# Patient Record
Sex: Female | Born: 1990 | Marital: Married | Smoking: Former smoker
Health system: Northeastern US, Academic
[De-identification: ages and names within clinical notes are randomized; demographics above are authoritative.]

---

## 2017-05-23 ENCOUNTER — Ambulatory Visit: Admitting: Family

## 2017-05-23 LAB — HX VAGINITIS SCREEN: CASE NUMBER: 2018309003671

## 2017-05-24 LAB — HX SEXUALLY TRAN DIS (AMP PRB)
CASE NUMBER: 2018309003673
HX C TRACHOMATIS DNA: NOT DETECTED — NL
HX N. GONORRHOEAE DNA: NOT DETECTED — NL
HX TOTAL RLU: 13

## 2017-06-06 ENCOUNTER — Ambulatory Visit

## 2018-03-28 ENCOUNTER — Ambulatory Visit: Admitting: Obstetrics & Gynecology

## 2018-03-29 LAB — HX SEXUALLY TRAN DIS (AMP PRB)
CASE NUMBER: 2019253002283
HX C TRACHOMATIS DNA: NOT DETECTED — NL
HX N. GONORRHOEAE DNA: NOT DETECTED — NL
HX TOTAL RLU: 7

## 2018-04-07 LAB — HX GYN FINAL REPORT
CASE NUMBER: 0
HX FINAL CYTOLOGIC INTERPRETATION: ABNORMAL
HX NOTE: 88141

## 2018-06-28 ENCOUNTER — Ambulatory Visit

## 2018-06-28 NOTE — Progress Notes (Addendum)
* * *        Emma Hartman, Emma Hartman**    --- ---    19 Y old Female, DOB: [birth year]    Account Number: [id number]    [address] [place], [id number]    Home: [phone]    Guarantor: Drema Balzarine Insurance: BCBS-[place]: BCBS (PP[place])    Referring: Carrolyn Meiers External Visit ID: [id number]    Appointment Facility: [facility]        * * *    06/28/2018  Progress Notes: Thereasa Distance, MD    --- ---    ---         **Current Medications**    ---    Taking     * birth control pills     ---    * Medication List reviewed and reconciled with the patient    ---      Past Medical History    ---       Previous PCP: last done in [place].        ---    ....        ---    INFLUENZAE VACCINE: .        ---    DEPRESSION SCREENING: PHQ2-Neg.        ---    GC/CHLAMYDIA: 03/28/2018 neg.        ---    PAP: 03/28/18 [facility] Women health follow up in one year.        ---    TDAP: 2018 thru Immigration process per patient.        ---    TOBACCO USE: former smoker.        ---       **Surgical History**    ---       tonsillectomy    ---      **Family History**    ---       Father: alive, diagnosed with Depression    ---    Mother: alive    ---    Sister: alive, healthy    ---    Paternal Grand Father: alive, Depression    ---    Paternal Grand Mother: alive, Depression    ---    Maternal Grand Father: deceased    ---    Maternal Grand Mother: alive, Cancer    ---    1 sister(s) - healthy.    ---      **Social History**    ---    Children: none.    Marijuana: no.    Education: BS/BA.    Seat belts: always.    Alcohol    alcohol _occasional_    Guns in home: none.    Tobacco Use    Status: _former smoker_    When did you stop smoking? _started at the age of 37 smoked for 5 yrs maybe  10-15 per day_    Patient screened/cessation counseled on: _12/11/2019_    Patient counseled on cessation: _12/11/2019_    Occupation: Research officer, political party.    Sexually active: OCP's.    Drugs: no.    Feels safe in home: yes.    Marital Status: married.       **Allergies**    ---       N.K.D.A.    ---       **Hospitalization/Major Diagnostic Procedure**    ---       No Hospitalization History.    ---       **Review  of Systems**    ---     _PAros_ :    CONSTITUTIONAL Denies Fever, Chills, Fatigue or Weight change.. OPHTHALMOLOGY  Denies redness, discharge or change in vision.Marland Kitchen ENT Denies Earache, Nasal  congestion, Sore throat.. RESPIRATORY Denies Cough, Shortness of breath,  Wheezing.Marland Kitchen CARDIOLOGY Denies Chest pain, Shortness of breath on exertion,  Palpitations or Edema.Marland Kitchen GASTROENTEROLOGY Denies Nausea, vomiting, abdominal  pain, Diarrhea, Constipation or Blood in stool.Marland Kitchen UROLOGY Denies Urinary  Frequency, Burning Sensation, Blood in urine or Flank pain.Marland Kitchen NEUROLOGY Denies  Headache, Dizziness, Paresthesias or Syncope.Deloria Lair Denies Joint  pain, Joint Swelling, Muscle Aches, Decreased Range of Motion.Marland Kitchen PSYCHOLOGY  Denies Anxiety, Depression, Suicidal or Homicidal Ideation.. DERMATOLOGY  Denies Suspicious Lesions or Rash.            **Reason for Appointment**    ---       1\. NP PE;pt confirmed apt ...mf 06/27/18    ---    2\. Was recently on antibiotic for abcess in mouth now has yeast infection and  discharge with odor    ---       **History of Present Illness**    ---     _Depression Screening_ :    PHQ-2 (2015 Edition)    Little interest or pleasure in doing things? _Not at all_    Feeling down, depressed, or hopeless? _Not at all_    Total Score _0_     _General_ :    Emma Hartman is a 27 year old pleasant female who presented to establish care and  annual physical. She has no significant past medical history.    She was recently on antibiotics for 2 Sets which is doing fine now however she  developed a vaginal discharge. She is going to try over-the-counter Monistat    PAP:2019    Tdap:2019    Flu Shot:at local Pharmacy.       **Vital Signs**    ---    BP 120/70, HR 86, Ht 62.25, Wt 112.6, BMI 20.43, RR 18, Oxygen sat % 100.       **Examination**    ---     _General  Examination_ :    GENERAL APPEARANCE: Well developed and well nourished, alert and oriented, no  acute distress, pleasant, cooperative .    OROPHARYNX: moist mucous membranes, no lesions.    NECK: supple, no lymphadenopathy, no thyromegaly, no carotid bruits, normal  ROM, non-tender.    HEART: regular rate and rhythm, normal S1 & S2, no murmurs, rubs, gallops or  clicks.    LUNGS: clear to auscultation bilaterally, no rhonchi, rales or wheezes.    ABDOMEN: benign, soft, non-tender, no organomegaly, bowel sounds are normal,  no guarding, no hepatosplenomegaly, no masses palpated, no rebound, no  rigidity, no suprapubic tenderness, Abdominal bruit: no.    BACK: unremarkable, normal, normal range of motion of spine, no paraspinal  tenderness, normal alignment, no CVA tenderness, no tenderness.    EXTREMITIES: no edema/calves non-tender/no cord, pulses 2 plus bilaterally.    NEUROLOGIC EXAM: grossly non-focal exam, CN's II-XII grossly intact, DTR's 2+  & symmetric all 4 extremities, gait normal, normal sensation and strength.    SKIN: no rash, no atypical nevi.    MUSCULOSKELETAL: no weakness, no deformities.    PSYCHOLOGICAL:  appropriate mood and affect .          **Assessments**    ---    1\. Annual physical exam - Z00.00 (Primary)    ---    2\. Vaginitis - N76.0    ---       **  Treatment**    ---       **1\. Annual physical exam**    _LAB: Urine Dip_   Glucose  neg       --- --- --- ---    Bilirubin  neg       --- --- --- ---    Ketones  neg       --- --- --- ---    Sp Gravity  1.015       --- --- --- ---    Blood  mod       --- --- --- ---    PH  6.0       --- --- --- ---    Protein  neg       --- --- --- ---    Emma Hartman  0.2       --- --- --- ---    Nitrate  neg       --- --- --- ---    Leukocytes  mod       --- --- --- ---       This lab was reviewed by Thereasa Distance MD on 06/30/2018 at 19:00 PM EST    --- ---    _LAB: URINE HCG_ Negative    _LAB: CBC w/ Diff_ RBC  3.69  L  3.90-5.20 - Mil/mm3    --- --- --- ---     Hgb  11.2  L  11.8-16.0 - Gm/dL    --- --- --- ---    Hct  34.3  L  36.0-47.0 - %    --- --- --- ---    MCV  93.0    80.0-100.0 - fL    --- --- --- ---    MCH  30.4    26.0-34.0 - pGm    --- --- --- ---    MCHC  32.7    31.0-37.0 - Gm/dL    --- --- --- ---    Platelet  256    150-400 - thous/mm3    --- --- --- ---    RDW-SD  40.8    35.0-51.0 - fL    --- --- --- ---    MPV  10.9    9.4-12.4 - fL    --- --- --- ---    WBC  5.7    3.7-11.2 - thous/mm3    --- --- --- ---      Blood work normal except for mild anemia. Please add vitamin B12, folate,  total iron, total iron binding capacity and ferritin to patient's labs if  possible Emma Hartman,Emma Hartman 07/05/2018 3:58:15 PM > labs added on for  patient This lab was reviewed by Adalberto Ill [place] on 07/05/2018 at 16:10  PM EST    --- ---    _LAB: Comprehensive Metabolic Panel_ Creatinine  0.577    0.550-1.300 - mg/dL    --- --- --- ---    Sodium Lvl  139    136-146 - mmol/L    --- --- --- ---    Potassium Lvl  4.0    3.6-5.2 - mmol/L    --- --- --- ---    Chloride  109    98-110 - mmol/L    --- --- --- ---    CO2  25    21-32 - mmol/L    --- --- --- ---    Total Protein  6.7    6.0-8.4 - Gm/dL    --- --- --- ---    Albumin Lvl  3.6  3.2-5.0 - Gm/dL    --- --- --- ---    Calcium Lvl  8.7    8.5-10.5 - mg/dL    --- --- --- ---    Glucose Lvl  80    70-110 - mg/dL    --- --- --- ---    Bili Total  0.6    0.2-1.2 - mg/dL    --- --- --- ---    Alk Phos  37    30-117 - Units/L    --- --- --- ---    AST  10    6-40 - Units/L    --- --- --- ---    ALT  17    6-55 - Units/L    --- --- --- ---    Anion Gap  5    3-11 -    --- --- --- ---    BUN  10    6-20 - mg/dL    --- --- --- ---      Blood work normal except for mild anemia. Please add vitamin B12, folate,  total iron, total iron binding capacity and ferritin to patient's labs if  possible Emma Hartman,Emma Hartman 07/05/2018 3:58:15 PM > labs added on for  patient This lab was reviewed by Adalberto Ill [place] on  07/05/2018 at 16:10  PM EST    --- ---    _LAB: Lipid Panel_ Chol  189    <=200 - mg/dL    --- --- --- ---    HDL  108    >=40 - mg/dL    --- --- --- ---    Trig  75    <=150 - mg/dL    --- --- --- ---    LDL  66    <=130 - mg/dL    --- --- --- ---    Status  Done     \-    --- --- --- ---      Blood work normal except for mild anemia. Please add vitamin B12, folate,  total iron, total iron binding capacity and ferritin to patient's labs if  possible Emma Hartman,Emma Hartman 07/05/2018 3:58:15 PM > labs added on for  patient This lab was reviewed by Adalberto Ill [place] on 07/05/2018 at 16:10  PM EST    --- ---    _LAB: TSH Reflex Panel (Recommended)_ 3rd Gen TSH  2.710    0.358-3.740 -  mclU/ml    --- --- --- ---      Blood work normal except for mild anemia. Please add vitamin B12, folate,  total iron, total iron binding capacity and ferritin to patient's labs if  possible Emma Hartman,Emma Hartman 07/05/2018 3:58:15 PM > labs added on for  patient This lab was reviewed by Adalberto Ill [place] on 07/05/2018 at 16:10  PM EST    --- ---        Notes: Appears healthy, discussed healthy diet exercising and getting him a  BMI blood work requested will call with results. A urine dip was done because  of the vaginal discharge which showed some blood and leukocytes. Patient is  not on her period. Will send for culture. Follow-up in one year or earlier if  needed.        **2\. Vaginitis**    _LAB: Urine Culture_   C Urine   Laboratory, [facility], [address],  [place], [place], [address]     \-    --- --- --- ---       This lab was reviewed  by Thereasa Distance MD on 06/30/2018 at 18:59 PM EST    --- ---         **3\. Others**    Notes: pt will bring her Birth control rx and we will prescribe similar.      **Procedure Codes**    ---       02542 Strip Urine Tests, manual w/out microscopy    ---    70623 URINE PREGNANCY TEST    ---       **Follow Up**    ---    1 Year    Electronically signed by Thereasa Distance MD , MD on  06/28/2018 at 01:21 PM EST    Sign off status: Completed        * * [facility]    [address]    [place], [place] [address]    Tel: [phone]    Fax: [phone]              * * *          Patient: Emma Hartman, Emma Hartman DOB: 1990/11/08 Progress Note: Thereasa Distance, MD  06/28/2018    ---    Note generated by eClinicalWorks EMR/PM Software ([email or web address])

## 2018-06-30 ENCOUNTER — Ambulatory Visit

## 2018-06-30 NOTE — Progress Notes (Addendum)
* * *        **  Emma Hartman, Emma Hartman**    --- ---    86 Y old Female, DOB: Oct 20, 1990    [address], [place], [place] [address]    Home: [phone]    Provider: [name], Wilena Tyndall        * * *    Telephone Encounter    ---    Answered by   [name], Bryan Goin  Date: 06/30/2018         Time: 06:56 PM    Reason   UA+    --- ---            Message                      Called pt to discuss results, left message, urine is + for Ecoli , since pt was symptomatic rx for Macrobid sent                Refills  Start Macrobid Capsule, 100 MG, Orally, 14, 1 capsule with food,  every 12 hrs, 7 day(s), Refills=0    --- ---          * * *                ---          * * *          Patient: Emma Hartman, Emma Hartman DOB: 1991/07/08 Provider: [name], Melana Hingle 06/30/2018    ---    Note generated by eClinicalWorks EMR/PM Software ([email or web address])

## 2018-07-04 ENCOUNTER — Ambulatory Visit

## 2018-07-04 LAB — HX LIPID PANEL
CASE NUMBER: 2019351000760
HX CHOL: 189 mg/dL — NL
HX HDL: 108 mg/dL — NL
HX LDL: 66 mg/dL — NL
HX TRIG: 75 mg/dL — NL

## 2018-07-04 LAB — HX GLOMERULAR FILTRATION RATE (ESTIMATED)
CASE NUMBER: 2019351000760
HX AFN AMER GLOMERULAR FILTRATION RATE: >90
HX NON-AFN AMER GLOMERULAR FILTRATION RATE: >90

## 2018-07-04 LAB — HX .AUTOMATED DIFF
CASE NUMBER: 2019351000760
HX ABSOLUTE BASO COUNT: 0.02 10*3/uL — NL (ref 0.0–0.22)
HX ABSOLUTE EOS COUNT: 0.25 10*3/uL — NL (ref 0.0–0.45)
HX ABSOLUTE LYMPHS COUNT: 2.72 10*3/uL — NL (ref 0.74–5.04)
HX ABSOLUTE MONO COUNT: 0.39 10*3/uL — NL (ref 0.0–1.34)
HX ABSOLUTE NEUTRO COUNT: 2.34 10*3/uL — NL (ref 1.48–7.95)
HX BASOPHILS: 0.3 %
HX EOSINOPHILS: 4.4 %
HX IMMATURE GRANULOCYTES: 0.2 % — NL (ref 0.0–2.0)
HX LYMPHOCYTES: 47.5 %
HX MONOCYTES: 6.8 %
HX NEUTROPHILS: 40.8 %

## 2018-07-04 LAB — HX CBC W/ DIFF
CASE NUMBER: 2019351000760
HX ABSOLUTE NRBC COUNT: 0 10*3/uL
HX HCT: 34.3 % — ABNORMAL LOW (ref 36.0–47.0)
HX HGB: 11.2 g/dL — ABNORMAL LOW (ref 11.8–16.0)
HX MCH: 30.4 pg — NL (ref 26.0–34.0)
HX MCHC: 32.7 g/dL — NL (ref 31.0–37.0)
HX MCV: 93 fL — NL (ref 80.0–100.0)
HX MPV: 10.9 fL — NL (ref 9.4–12.4)
HX NRBC PERCENT: 0 % — NL
HX PLATELET: 256 10*3/uL — NL (ref 150.0–400.0)
HX RBC: 3.69 10*6/uL — ABNORMAL LOW (ref 3.9–5.2)
HX RDW-CV: 11.9 % — NL (ref 11.5–14.5)
HX RDW-SD: 40.8 fL — NL (ref 35.0–51.0)
HX WBC: 5.7 10*3/uL — NL (ref 3.7–11.2)

## 2018-07-04 LAB — HX COMPREHENSIVE METABOLIC PANEL
CASE NUMBER: 2019351000760
HX ALBUMIN LVL: 3.6 g/dL — NL (ref 3.2–5.0)
HX ALKALINE PHOSPHATASE: 37 U/L — NL (ref 30.0–117.0)
HX ALT: 17 U/L — NL (ref 6.0–55.0)
HX ANION GAP: 5 — NL (ref 3.0–11.0)
HX AST: 10 U/L — NL (ref 6.0–40.0)
HX BILIRUBIN TOTAL: 0.6 mg/dL — NL (ref 0.2–1.2)
HX BUN: 10 mg/dL — NL (ref 6.0–20.0)
HX CALCIUM LVL: 8.7 mg/dL — NL (ref 8.5–10.5)
HX CHLORIDE: 109 mmol/L — NL (ref 98.0–110.0)
HX CO2: 25 mmol/L — NL (ref 21.0–32.0)
HX CREATININE: 0.577 mg/dL — NL (ref 0.55–1.3)
HX GLUCOSE LVL: 80 mg/dL — NL (ref 70.0–110.0)
HX POTASSIUM LVL: 4 mmol/L — NL (ref 3.6–5.2)
HX SODIUM LVL: 139 mmol/L — NL (ref 136.0–146.0)
HX TOTAL PROTEIN: 6.7 g/dL — NL (ref 6.0–8.4)

## 2018-07-04 LAB — HX TSH REFLEX PANEL (RECOMMENDED)
CASE NUMBER: 2019351000760
HX 3RD GEN TSH: 2.71 u[IU]/mL — NL (ref 0.358–3.74)

## 2018-07-04 NOTE — Progress Notes (Addendum)
* * *        **  Emma Hartman, Emma Hartman**    --- ---    74 Y old Female, DOB: [birth year]    [address], [place], [place] [address]    Home: [phone]    Provider: [name], Jahir Halt        * * *    Telephone Encounter    ---    Answered by   Sherian Rein, Christine  Date: 07/04/2018         Time: 09:23 AM    Message                      patient came in with copy of Birth control information sheet name of BC is Diane 35      patient would like results of urine culture can you please review.       patient also brought in her Immunization paperwork and needs titres done only tdap noted please order labs for patient, thank you she had her blood work done this morning for other testing ordered        --- ---            Action Taken                      Abuyog LPN,Donaveil   62/13/0865 11:03:08 AM > pt called inquiring about her birth control rx. pharmacy has not recvd this. pls advise      Colbe Viviano MD,Shatora Weatherbee  07/13/2018 9:09:30 PM > The OCP that Pt is requesting is not available in [place] and the combination has safety concerns. I know pt expressed her concerns about other OCP's . If she wants we can do a referral  for Gyn      Deschene [place],Christine  07/14/2018 10:47:07 AM > patient informed please schedule apn't with GYN for as soon as possible, thank you      [facility]  07/14/2018 10:57:12 AM > appointment with Dr. Janie Morning, OB-GYN, [address] [place], [place], [phone], on 08/07/18 @ 8:00 am      [name]  07/14/2018 10:57:28 AM > spoke to patient she is aware, also emailed the information                    * * *              * * *        ---         ---          * * *          Patient: Emma Hartman, Emma Hartman DOB: [birth year] Provider: [name], Ethleen Lormand 07/04/2018    ---    Note generated by eClinicalWorks EMR/PM Software ([email or web address])

## 2018-07-05 ENCOUNTER — Ambulatory Visit

## 2018-07-05 LAB — HX IRON PROFILE
CASE NUMBER: 2019351000760
HX % IRON BOUND: 43 % — NL (ref 10.0–50.0)
HX FERRITIN LVL: 19 ng/mL — NL (ref 11.0–307.0)
HX IRON: 171 ug/dL — ABNORMAL HIGH (ref 30.0–160.0)
HX TIBC: 400 ug/dL — NL (ref 250.0–450.0)

## 2018-07-05 LAB — HX VITAMIN B12 LEVEL
CASE NUMBER: 2019351000760
HX VITAMIN B12 LVL: 406 pg/mL — NL (ref 193.0–986.0)

## 2018-07-05 LAB — HX FOLATE LEVEL
CASE NUMBER: 2019351000760
HX FOLATE LVL: 17.7 ng/mL — NL

## 2018-07-05 NOTE — Progress Notes (Addendum)
* * *        **  Emma Hartman, Emma Hartman**    --- ---    69 Y old Female, DOB: November 14, 1990    [address], [place], [place] [address]    Home: [phone]    Provider: [name], Dina Mobley        * * *    Telephone Encounter    ---    Answered by   Sherian Rein, Christine  Date: 07/05/2018         Time: 03:59 PM    Reason   lab order    --- ---            Message                      add on labs sent to lab for patient per dr. Luisa Hart                    * * *              * * *        ---        Reason for Appointment    ---      1\. Lab order    ---      Assessments    ---    1\. Anemia - D64.9 (Primary)    ---      Treatment    ---       **1\. Anemia**    _LAB: Ferritin_    _LAB: Folate Level_    _LAB: Iron Binding Capacity Total_    _LAB: Iron Level_    _LAB: Vitamin B12 Level_    ---          * * *           Patient: Emma Hartman, Emma Hartman DOB: June 07, 1991 Provider: [name], Elysha Daw 07/05/2018    ---    Note generated by eClinicalWorks EMR/PM Software ([email or web address])

## 2018-07-21 ENCOUNTER — Ambulatory Visit

## 2018-08-07 ENCOUNTER — Ambulatory Visit: Admitting: Obstetrics & Gynecology

## 2018-12-26 ENCOUNTER — Ambulatory Visit

## 2018-12-26 NOTE — Progress Notes (Addendum)
* * *      Hartman, Emma **DOB:** 1990/11/06 (27 yo F) **Acc No.** [id number] **DOS:**  12/26/2018    ---       [name], Blakelee**    ------    60 Y old Female, DOB: [birth year]    [address], [place], [place] [address]    Home: [phone]    Provider: [name], [name]        * * *    Telephone Encounter    ---    Answered by  [name] Date: 12/26/2018       Time: 10:10 AM    Caller  pt husband    ------            Reason  wants c/b from office            Message                     red spots on body- [phone]                Action Taken                     Deschene [place],Christine  12/27/2018 11:44:13 AM > lmtcb      Deschene Emmett,Christine  12/27/2018 12:49:08 PM > patient called states she stopped taking her birth control because she is trying to get pregnant and now has bad acne especially on her back, patient states that is one of the reasons she was on birth control to help with the acne.  Patient would like referral to dermatologist at this time please advise      [name] MD,[name]  12/27/2018 3:05:41 PM > ok for the referral to derm      [name]  12/28/2018 1:07:02 PM > working on apt                     * * *                ---          * * *         Provider: [name], [name] 12/26/2018    ---    Note generated by eClinicalWorks EMR/PM Software ([email or web address])

## 2019-03-07 ENCOUNTER — Ambulatory Visit

## 2019-03-14 ENCOUNTER — Ambulatory Visit: Admitting: Obstetrics & Gynecology

## 2019-03-19 ENCOUNTER — Ambulatory Visit: Admitting: Obstetrics & Gynecology

## 2019-03-19 LAB — HX URINALYSIS (COMPLETE)
CASE NUMBER: 2020244003763
HX UA BILIRUBIN: NEGATIVE — NL
HX UA BLOOD: NEGATIVE — NL
HX UA GLUCOSE: NEGATIVE — NL
HX UA KETONES: NEGATIVE — NL
HX UA LEUKOCYTE ESTERASE: NEGATIVE — NL
HX UA NITRITE: NEGATIVE — NL
HX UA PH: 6 — NL (ref 5.0–8.0)
HX UA PROTEIN: NEGATIVE — NL
HX UA RBC: 1 /HPF — NL (ref 0.0–2.0)
HX UA SPECIFIC GRAVITY: 1.019 — NL (ref 1.003–1.03)
HX UA SQUAMOUS EPITHELIAL: <1 — NL (ref 0.0–5.0)
HX UA UROBILINOGEN: NEGATIVE — NL
HX UA WBC: <1 — NL (ref 0.0–5.0)

## 2019-03-19 LAB — HX CBC W/ INDICES
CASE NUMBER: 2020244003014
HX ABSOLUTE NRBC COUNT: 0 10*3/uL
HX HCT: 34.7 % — ABNORMAL LOW (ref 36.0–47.0)
HX HGB: 11.7 g/dL — ABNORMAL LOW (ref 11.8–16.0)
HX MCH: 32.2 pg — NL (ref 26.0–34.0)
HX MCHC: 33.7 g/dL — NL (ref 31.0–37.0)
HX MCV: 95.6 fL — NL (ref 80.0–100.0)
HX MPV: 10.8 fL — NL (ref 9.4–12.4)
HX NRBC PERCENT: 0 % — NL
HX PLATELET: 270 10*3/uL — NL (ref 150.0–400.0)
HX RBC: 3.63 10*6/uL — ABNORMAL LOW (ref 3.9–5.2)
HX RDW-CV: 11.5 % — NL (ref 11.5–14.5)
HX RDW-SD: 40.2 fL — NL (ref 35.0–51.0)
HX WBC: 10.3 10*3/uL — NL (ref 3.7–11.2)

## 2019-03-19 LAB — HX HEPATITIS C ANTIBODY
CASE NUMBER: 2020244003014
HX HEP C AB INST: NONREACTIVE
HX HEP C AB MEASURED: 0.1 — NL (ref 0.0–0.8)
HX HEP C AB: NONREACTIVE

## 2019-03-19 LAB — HX HEPATITIS B SURFACE ANTIGEN PRENATAL
CASE NUMBER: 2020244003014
HX HBSAG MEASURED: <0.1 — NL (ref 0.0–0.99)
HX HEP B SURFACE AG PRENATAL: NEGATIVE
HX HEP BS AG INST: NONREACTIVE

## 2019-03-19 LAB — HX RUBELLA ANTIBODY IGG
CASE NUMBER: 2020244003014
HX RUBELLA IGG: 56.7 [IU]/mL

## 2019-03-19 LAB — HX HIV 1/2 ANTIGEN/ANTIBODY COMBINATION ASS
CASE NUMBER: 2020244003015
HX HIV-1/HIV-2 AG/AB COMBO SCREEN: NONREACTIVE

## 2019-03-20 LAB — HX SEXUALLY TRAN DIS (AMP PRB)
CASE NUMBER: 2020244003765
HX C TRACHOMATIS DNA: NOT DETECTED
HX N. GONORRHOEAE DNA: NOT DETECTED

## 2019-03-20 LAB — HX ABO/RH TYPE
CASE NUMBER: 2020244003014
HX ABO/RH TYPE: O POS — NL

## 2019-03-20 LAB — HX ANTIBODY SCREEN
CASE NUMBER: 2020244003014
HX ANTIBODY SCREEN AUTOMATED: NEGATIVE — NL

## 2019-03-20 LAB — HX RPR
CASE NUMBER: 2020244003014
HX RPR QUAL: NONREACTIVE — NL

## 2019-03-20 LAB — HX URINE CULTURE
CASE NUMBER: 2020244003764
HX F: NO GROWTH

## 2019-03-23 LAB — HX GYN FINAL REPORT
CASE NUMBER: 0
HX FINAL CYTOLOGIC INTERPRETATION: NEGATIVE

## 2019-03-23 LAB — HX HEMOGLOBIN EVALUATION
CASE NUMBER: 2020244003014
HX HGB A1: 96.1 %
HX HGB A2: 2.4 %
HX HGB F: 1.5 %

## 2019-04-16 ENCOUNTER — Ambulatory Visit: Admitting: Obstetrics & Gynecology

## 2019-04-16 ENCOUNTER — Ambulatory Visit: Admitting: Specialist

## 2019-04-16 LAB — HX .MYRIAD KIT DRAW
CASE NUMBER: 2020272001328
HX FORESIGHT KIT: 11004512600000
HX PRELUDE KIT: 31004878200000

## 2019-05-14 ENCOUNTER — Ambulatory Visit: Admitting: Obstetrics & Gynecology

## 2019-06-12 ENCOUNTER — Ambulatory Visit: Admitting: Advanced Practice Midwife

## 2019-06-12 ENCOUNTER — Ambulatory Visit: Admitting: Obstetrics & Gynecology

## 2019-06-26 ENCOUNTER — Ambulatory Visit: Admitting: Obstetrics & Gynecology

## 2019-07-10 ENCOUNTER — Ambulatory Visit: Admitting: Advanced Practice Midwife

## 2019-07-19 ENCOUNTER — Ambulatory Visit

## 2019-07-19 LAB — HX COMPREHENSIVE METABOLIC PANEL
CASE NUMBER: 2020366001169
HX ALBUMIN LVL: 3 g/dL — ABNORMAL LOW (ref 3.2–5.0)
HX ALKALINE PHOSPHATASE: 71 U/L — NL (ref 30.0–117.0)
HX ALT: 35 U/L — NL (ref 6.0–55.0)
HX ANION GAP: 7 — NL (ref 3.0–11.0)
HX AST: 23 U/L — NL (ref 6.0–40.0)
HX BILIRUBIN TOTAL: 0.3 mg/dL — NL (ref 0.2–1.2)
HX BUN: 8 mg/dL — NL (ref 6.0–20.0)
HX CALCIUM LVL: 9.2 mg/dL — NL (ref 8.5–10.5)
HX CHLORIDE: 107 mmol/L — NL (ref 98.0–110.0)
HX CO2: 26 mmol/L — NL (ref 21.0–32.0)
HX CREATININE: 0.365 mg/dL — ABNORMAL LOW (ref 0.55–1.3)
HX GLUCOSE LVL: 88 mg/dL — NL (ref 70.0–110.0)
HX POTASSIUM LVL: 4.2 mmol/L — NL (ref 3.6–5.2)
HX SODIUM LVL: 140 mmol/L — NL (ref 136.0–146.0)
HX TOTAL PROTEIN: 6.2 g/dL — NL (ref 6.0–8.4)

## 2019-07-19 LAB — HX GLOMERULAR FILTRATION RATE (ESTIMATED)
CASE NUMBER: 2020366001169
HX AFN AMER GLOMERULAR FILTRATION RATE: >90
HX NON-AFN AMER GLOMERULAR FILTRATION RATE: >90

## 2019-07-19 LAB — HX TSH REFLEX PANEL (RECOMMENDED)
CASE NUMBER: 2020366001169
HX 3RD GEN TSH: 1.63 u[IU]/mL — NL (ref 0.358–3.74)

## 2019-07-19 NOTE — Progress Notes (Addendum)
 * * *    [name], [name] **DOB:** 15-Dec-1990 (28 yo F) **Acc No.** [id number] **DOS:**  07/19/2019    ---       Emma Hartman, Emma Hartman**    ------    66 Y old Female, DOB: [birth year]    Account Number: [id number]    [address] [place], [place]    Home: [phone]    Guarantor: Drema Balzarine Insurance: BCBS-: BCBS (PPO)    Referring: Carrolyn Meiers External Visit ID: [id number]    Appointment Facility: [facility]        * * *    07/19/2019 Progress Notes: Thereasa Distance, MD    ------    ---        ---     Past Medical History    ---      Previous PCP: last done in [place].        ---    ....        ---    INFLUENZAE VACCINE: .        ---    DEPRESSION SCREENING: PHQ2-Neg.        ---    GC/CHLAMYDIA: 03/28/2018 neg.        ---    PAP: 03/28/18 LGSIL Women health follow up in one year; 03/19/19 neg.        ---    TDAP: 2018 thru Immigration process per patient.        ---    TOBACCO USE: former smoker.        ---      **Surgical History**    ---      tonsillectomy    ---     **Family History**    ---      Father: alive, diagnosed with Depression    ---    Mother: alive    ---    Sister: alive, healthy    ---    Paternal Grand Father: alive, Depression    ---    Paternal Grand Mother: alive, Depression    ---    Maternal Grand Father: deceased    ---    Maternal Grand Mother: alive, Cancer    ---    1 sister(s) - healthy.    ---     **Social History**    ---     _General:_    Marijuana: no.    Seat belts: always.    Guns in home: none.    Feels safe in home: yes.    Drugs: no.    Alcohol: alcohol occasional.    Children: none.    Education: BS/BA.    Marital Status: married.    Occupation: Research officer, political party.    Sexually active: OCP's.    Tobacco Use: Status: former smoker, When did you stop smoking? started at the  age of 68 smoked for 5 yrs maybe 10-15 per day, Patient screened/cessation  counseled on: 07/19/2019.     **Allergies**    ---      N.K.D.A.    ---      **Hospitalization/Major Diagnostic  Procedure**    ---      No Hospitalization History.    ---      **Review of Systems**    ---     _PAros_ :    CONSTITUTIONAL Denies Fever, Chills, Fatigue .Marland Kitchen OPHTHALMOLOGY Denies redness,  discharge or change in vision.Marland Kitchen ENT Denies Earache, Nasal congestion, Sore  throat.. RESPIRATORY Denies Cough, Shortness of breath, Wheezing.Marland Kitchen CARDIOLOGY  Denies Chest pain, Shortness of breath on exertion, Palpitations or Edema.Marland Kitchen  GASTROENTEROLOGY Denies Nausea, vomiting, abdominal pain, Diarrhea,  Constipation or Blood in stool.Marland Kitchen UROLOGY Denies Urinary Frequency, Burning  Sensation, Blood in urine or Flank pain.Marland Kitchen NEUROLOGY Denies Headache,  Dizziness, Paresthesias or Syncope.Occasionaly feeling weak. MUSCULOSKELETAL  Denies Joint pain, Joint Swelling, Muscle Aches, Decreased Range of Motion.Marland Kitchen  PSYCHOLOGY Denies Anxiety, Depression, Suicidal or Homicidal Ideation..  DERMATOLOGY Denies Suspicious Lesions or Rash.          **Reason for Appointment**    ---      1\. PE;conf appt, neg screen 07/18/19    ---    2\. [redacted] weeks pregnant would like to discuss feeling dizzy at times and ?anemia    ---      **History of Present Illness**    ---     _General_ :    Emma Hartman is a 28 year old pleasant female who presented for annual physical she is  following with the OB/GYN for prenatal care she is [redacted] weeks pregnant. Her only  concern is occasional dizziness and weakness    PAP: 2020    Tdap: Up-to-date    Flu Shot: Received per patient.      **Vital Signs**    ---    Temp 98.3, HR 106, Weight Change 17.4 lb, RR 18, BP 94/60, Oxygen sat % 100,  Ht 62.25, Wt 130, BMI 23.58.      **Examination**    ---     _General Examination:_    GENERAL APPEARANCE: Well developed and well nourished, alert and oriented, no  acute distress, pleasant, cooperative .    OROPHARYNX: moist mucous membranes, no lesions.    NECK: supple, no lymphadenopathy, no thyromegaly, no carotid bruits, normal  ROM, non-tender.    HEART: regular rate and rhythm, normal S1 & S2,  no murmurs, rubs, gallops or  clicks.    LUNGS: clear to auscultation bilaterally, no rhonchi, rales or wheezes.    ABDOMEN: benign, soft, non-tender, no organomegaly, bowel sounds are normal,  no guarding, no hepatosplenomegaly, no masses palpated, no rebound, no  rigidity, no suprapubic tenderness, Abdominal bruit: no.    BACK: unremarkable, normal, normal range of motion of spine, no paraspinal  tenderness, normal alignment, no CVA tenderness, no tenderness.    EXTREMITIES: no edema/calves non-tender/no cord, pulses 2 plus bilaterally.    NEUROLOGIC EXAM: grossly non-focal exam, CN's II-XII grossly intact, DTR's 2+  & symmetric all 4 extremities, gait normal, normal sensation and strength.    SKIN: no rash, no atypical nevi.    MUSCULOSKELETAL: no weakness, no deformities.    PSYCHOLOGICAL: appropriate mood and affect .         **Assessments**    ---    1\. Annual physical exam - Z00.00 (Primary)    ---    2\. [redacted] weeks gestation of pregnancy - Z3A.25    ---      **Treatment**    ---      **1\. Annual physical exam**    _LAB: Comprehensive Metabolic Panel_   Value Reference Range    ---------    Creatinine 0.365 L 0.550-1.300 - mg/dL    Sodium Lvl 254  270-623 - mmol/L    ------------    Potassium Lvl 4.2  3.6-5.2 - mmol/L    ------------    Chloride 107  98-110 - mmol/L    ------------    CO2 26  21-32 - mmol/L    ------------    Total Protein 6.2  6.0-8.4 - Gm/dL    ------------    Albumin Lvl 3.0 L 3.2-5.0 - Gm/dL    ------------    Calcium Lvl 9.2  8.5-10.5 - mg/dL    ------------    Glucose Lvl 88  70-110 - mg/dL    ------------    Bili Total 0.3  0.2-1.2 - mg/dL    ------------    Alk Phos 71  30-117 - Units/L    ------------    AST 23  6-40 - Units/L    ------------    ALT 35  6-55 - Units/L    ------------    Anion Gap 7  3-11 -    ------------    BUN 8  6-20 - mg/dL    ------------     normal tsh and cmp  Emma Hartman,Emma Hartman 07/23/2019 3:51:38 PM > lmtcb  Emma [place],Emma Hartman 07/23/2019 4:10:56 PM > patient informed of results This  lab was reviewed by Adalberto Ill [place] on 07/23/2019 at 16:11 PM EST    ------    _LAB: TSH Reflex Panel (Recommended)_  Value Reference Range    ---------    3rd Gen TSH 1.630  0.358-3.740 - mclU/ml     normal tsh and cmp Emma [place],Emma Hartman 07/23/2019 3:51:38 PM > lmtcb  Emma [place],Emma Hartman 07/23/2019 4:10:56 PM > patient informed of results This  lab was reviewed by Adalberto Ill [place] on 07/23/2019 at 16:11 PM EST    ------        Notes: Patient Educated with: Physical activity.pdf (Physical activity.pdf)        Appears healthy, discussed healthy diet and exercise. Reviewed the recent test  results which showed normal CBC. Patient feels occasional dizziness most  likely because of the low blood pressure advice patient on adequate hydration  and if needed salty snacks. She will monitor her blood pressure closely. We  will also check thyroid and electrolytes.        **2\. [redacted] weeks gestation of pregnancy**    Notes: Continue prenatal vitamins and follow-up with the OB/GYN.     **Visit Codes**    ---      [phone] PREV MED VISIT EST PT 18-39 YR.    ---     **Follow Up**    ---    1 Year (Reason: px)    Electronically signed by Thereasa Hartman , MD on 07/19/2019 at 09:38 AM EST    Sign off status: Completed        * * [facility]    [address]    [place], [place] [address]    Tel: [phone]    Fax: [phone]              * * *          Progress Note: Thereasa Distance, MD 07/19/2019    ---    Note generated by eClinicalWorks EMR/PM Software ([email or web address])

## 2019-08-10 ENCOUNTER — Ambulatory Visit: Admitting: Specialist

## 2019-08-10 LAB — HX CBC W/ INDICES
CASE NUMBER: 2021022002908
HX ABSOLUTE NRBC COUNT: 0 10*3/uL
HX HCT: 32.9 % — ABNORMAL LOW (ref 36.0–47.0)
HX HGB: 11 g/dL — ABNORMAL LOW (ref 11.8–16.0)
HX MCH: 32.6 pg — NL (ref 26.0–34.0)
HX MCHC: 33.4 g/dL — NL (ref 31.0–37.0)
HX MCV: 97.6 fL — NL (ref 80.0–100.0)
HX MPV: 11.1 fL — NL (ref 9.4–12.4)
HX NRBC PERCENT: 0 % — NL
HX PLATELET: 273 10*3/uL — NL (ref 150.0–400.0)
HX RBC: 3.37 10*6/uL — ABNORMAL LOW (ref 3.9–5.2)
HX RDW-CV: 12.2 % — NL (ref 11.5–14.5)
HX RDW-SD: 43.9 fL — NL (ref 35.0–51.0)
HX WBC: 12.2 10*3/uL — ABNORMAL HIGH (ref 3.7–11.2)

## 2019-08-10 LAB — HX HEPATITIS C ANTIBODY
CASE NUMBER: 2021022002908
HX HEP C AB INST: NONREACTIVE
HX HEP C AB MEASURED: 0.03 — NL (ref 0.0–0.8)
HX HEP C AB: NONREACTIVE

## 2019-08-10 LAB — HX HIV 1/2 ANTIGEN/ANTIBODY COMBINATION ASS
CASE NUMBER: 2021022002909
HX HIV-1/HIV-2 AG/AB COMBO SCREEN: NONREACTIVE

## 2019-08-11 ENCOUNTER — Ambulatory Visit: Admitting: Specialist

## 2019-08-11 LAB — HX .GLUCOSE 3 HR PRENATAL
CASE NUMBER: 2021023001079
HX GLU 3 HR PRENATAL: 79 mg/dL — NL (ref 70.0–139.0)

## 2019-08-11 LAB — HX .GLUCOSE 2 HR PRENATAL
CASE NUMBER: 2021023000837
HX GLU 2 HR PRENATAL: 79 mg/dL — NL (ref 70.0–154.0)

## 2019-08-11 LAB — HX ANTIBODY SCREEN
CASE NUMBER: 2021022002908
HX ANTIBODY SCREEN AUTOMATED: NEGATIVE — NL

## 2019-08-11 LAB — HX ABO/RH TYPE
CASE NUMBER: 2021022002908
HX ABO/RH TYPE: O POS — NL

## 2019-08-11 LAB — HX .GLUCOSE BASELINE PRENATAL
CASE NUMBER: 2021023000541
HX GLUC FASTING PRENATAL: 72 mg/dL — NL (ref 70.0–94.0)

## 2019-08-11 LAB — HX RPR
CASE NUMBER: 2021022002908
HX RPR QUAL: NONREACTIVE — NL

## 2019-08-11 LAB — HX .GLUCOSE 1 HR PRENATAL
CASE NUMBER: 2021023000690
HX GLU 1 HR PRENATAL: 110 mg/dL — NL (ref 70.0–179.0)

## 2019-08-30 ENCOUNTER — Ambulatory Visit: Admitting: Specialist

## 2019-09-13 ENCOUNTER — Ambulatory Visit: Admitting: Advanced Practice Midwife

## 2019-09-24 ENCOUNTER — Ambulatory Visit: Admitting: Obstetrics & Gynecology

## 2019-10-04 ENCOUNTER — Ambulatory Visit: Admitting: Family

## 2019-10-04 ENCOUNTER — Ambulatory Visit: Admitting: Obstetrics & Gynecology

## 2019-10-05 LAB — HX VAG/REC GROUP B BY PCR PEN ALLERGIC
CASE NUMBER: 2021077003094
HX GROUP B STREP PEN ALLERGIC BY PCR: NOT DETECTED

## 2019-10-11 ENCOUNTER — Ambulatory Visit: Admitting: Obstetrics & Gynecology

## 2019-10-18 ENCOUNTER — Ambulatory Visit

## 2019-10-22 ENCOUNTER — Ambulatory Visit: Admitting: Specialist

## 2019-10-22 LAB — HX COVID19 BY PCR (LGH)
CASE NUMBER: 2021095001965
HX COVID19 BY PCR: NOT DETECTED

## 2019-10-24 ENCOUNTER — Ambulatory Visit

## 2019-10-24 ENCOUNTER — Inpatient Hospital Stay
Admit: 2019-10-24 | Disposition: A | Discharge status: Home / Self Care | Source: Ambulatory Visit | Attending: Specialist | Admitting: Specialist

## 2019-10-24 LAB — HX CBC W/ INDICES
CASE NUMBER: 2021097000839
HX ABSOLUTE NRBC COUNT: 0 10*3/uL
HX HCT: 37.2 % — NL (ref 36.0–47.0)
HX HGB: 12.5 g/dL — NL (ref 11.8–16.0)
HX MCH: 31.2 pg — NL (ref 26.0–34.0)
HX MCHC: 33.6 g/dL — NL (ref 31.0–37.0)
HX MCV: 92.8 fL — NL (ref 80.0–100.0)
HX MPV: 11.1 fL — NL (ref 9.4–12.4)
HX NRBC PERCENT: 0 % — NL
HX PLATELET: 282 10*3/uL — NL (ref 150.0–400.0)
HX RBC: 4.01 10*6/uL — NL (ref 3.9–5.2)
HX RDW-CV: 12.9 % — NL (ref 11.5–14.5)
HX RDW-SD: 43.2 fL — NL (ref 35.0–51.0)
HX WBC: 15.6 10*3/uL — ABNORMAL HIGH (ref 3.7–11.2)

## 2019-10-24 LAB — HX ABO/RH TYPE
CASE NUMBER: 2021097000839
HX ABO/RH TYPE: O POS — NL

## 2019-10-24 LAB — HX ANTIBODY SCREEN
CASE NUMBER: 2021097000839
HX ANTIBODY SCREEN AUTOMATED: NEGATIVE — NL

## 2019-10-24 NOTE — Op Note (Signed)
 ____________________________________________________________    OPERATIVE REPORT  DATE:  10/24/2019    PREOPERATIVE DIAGNOSES:  1.  Intrauterine pregnancy at 39 weeks.  2.  Homero Fellers breech presentation.    POSTOPERATIVE DIAGNOSES:  1.  Intrauterine pregnancy at 39 weeks.  2.  Homero Fellers breech presentation, delivered.    PROCEDURE:  Primary low transverse cesarean section via Pfannenstiel skin   incision.    SURGEON:  Randell Loop, M.D.    ASSISTANT:  Wayland Denis, D.O.    ANESTHESIA:   Spinal.    ESTIMATED BLOOD LOSS:  700 mL.  QBL pending.    FLUIDS:  900 mL.    URINE OUTPUT:  180 mL clear yellow urine.    FINDINGS:  Vigorous female infant in frank breech presentation, sacrum anterior.   Normal-appearing uterus, tubes and ovaries.  Apgars 8 and 9, weight 3780 grams.   Delivered at 11:04 a.m.    SPECIMENS:  1.  Cord blood to lab.  2.  Placenta to hold.    DESCRIPTION OF PROCEDURE:  The patient was taken to the Operating Room where   spinal anesthesia was administered and made adequate for surgery.  Two grams of   IV Ancef were given.  A Foley catheter was placed to drain the bladder.    Pneumatic boots were placed on the lower extremities.  The patient was prepped   and draped in the usual sterile fashion.  After testing to be sure adequate   anesthesia had been achieved for surgery, a Pfannenstiel skin incision was made   with a knife and carried through to the underlying layer of fascia.  The fascia   was nicked in the midline.  The fascial incision was extended laterally.  The   fascia was then dissected sharply off the underlying rectus muscles.  The rectus  muscles were separated in the midline.  The peritoneum was entered sharply and   the peritoneal incision was extended superiorly and inferiorly with good   visualization of the bladder.  A bladder blade was inserted, and a low   transverse hysterotomy made with the knife.  This was then extended bluntly.    The membranes were ruptured for clear fluid.   The infant was found to be in   frank breech presentation, sacrum anterior.  The buttocks were then lifted and   with fundal were administered, the right leg was able to be flexed and   delivered.  The left leg was flexed and delivered.  The right arm was then able   to be swept and delivered.  The infant was rotated 180 degrees to release and   deliver the left arm.  The head was then flexed and delivered without   difficulty.  The baby was vigorous and crying at birth.  Apgars 8 and 9.    Delivery time 11:04 hours.  Delayed cord clamping was performed for 30 seconds.   The baby was then handed to the waiting neonatal team and weight was determined   to be 3780 grams.  Three-vessel cord was noted.  Cord blood was obtained.  The   uterus was then massaged to express the placenta.  The uterus was exteriorized   and cleared of any clots, debris and placental tissue.  The hysterotomy was then  closed using 0 V-Loc suture care with the imbricating layer with a second   imbricating layer with the same suture.  Excellent hemostasis was noted and the   uterus was initially boggy; however, it responded  well to IV Pitocin and   massage.  The uterus was then returned to the abdomen.  The abdomen and pelvis   were irrigated with warm sterile saline and excellent hemostasis was noted.  The  pyramidalis muscles and peritoneum were reapproximated in the midline with a   single 0 Vicryl interrupted suture.  The fascia was examined and found to be   intact and unremarkable.  It was reapproximated using a 0 Vicryl running suture,  taking care at the apices were incorporated into the suture line.  The   subcutaneous tissues were irrigated with warm sterile saline and any small   bleeding points were cauterized.  The subcutaneous tissues were then   reapproximated using 3-0 plain gut interrupted sutures.  The skin edges were   then reapproximated using 4-0 Monocryl subcuticular suture with good cosmetic   results.  Sponge, lap, needle  and instrument counts were correct x3.  The   patient was taken to the Recovery Room in stable condition.  The baby was taken   to the Recovery Room in mother's arms.    Dictated by:  Randell Loop, M.D.    DD: 10/24/2019 11:46:32  DT: 10/24/2019 12:10:00  MC/tam  Job #: [id number]   SIGNATURE LINE    Electronically signed by Berna Bue MD, Zachary George on 10/29/2019 at 14:06:19 EST

## 2019-10-25 LAB — HX .AUTOMATED DIFF
CASE NUMBER: 2021098000488
HX ABSOLUTE BASO COUNT: 0.02 10*3/uL — NL (ref 0.0–0.22)
HX ABSOLUTE EOS COUNT: 0.1 10*3/uL — NL (ref 0.0–0.45)
HX ABSOLUTE LYMPHS COUNT: 3.31 10*3/uL — NL (ref 0.74–5.04)
HX ABSOLUTE MONO COUNT: 0.89 10*3/uL — NL (ref 0.0–1.34)
HX ABSOLUTE NEUTRO COUNT: 9.93 10*3/uL — ABNORMAL HIGH (ref 1.48–7.95)
HX BASOPHILS: 0.1 %
HX EOSINOPHILS: 0.7 %
HX IMMATURE GRANULOCYTES: 1.2 % — NL (ref 0.0–2.0)
HX LYMPHOCYTES: 22.9 %
HX MONOCYTES: 6.2 %
HX NEUTROPHILS: 68.9 %

## 2019-10-25 LAB — HX CBC W/ DIFF
CASE NUMBER: 2021098000488
HX ABSOLUTE NRBC COUNT: 0 10*3/uL
HX HCT: 28.3 % — ABNORMAL LOW (ref 36.0–47.0)
HX HGB: 9.3 g/dL — ABNORMAL LOW (ref 11.8–16.0)
HX MCH: 31 pg — NL (ref 26.0–34.0)
HX MCHC: 32.9 g/dL — NL (ref 31.0–37.0)
HX MCV: 94.3 fL — NL (ref 80.0–100.0)
HX MPV: 10.8 fL — NL (ref 9.4–12.4)
HX NRBC PERCENT: 0 % — NL
HX PLATELET: 217 10*3/uL — NL (ref 150.0–400.0)
HX RBC: 3 10*6/uL — ABNORMAL LOW (ref 3.9–5.2)
HX RDW-CV: 13.1 % — NL (ref 11.5–14.5)
HX RDW-SD: 44.9 fL — NL (ref 35.0–51.0)
HX WBC: 14.4 10*3/uL — ABNORMAL HIGH (ref 3.7–11.2)

## 2019-10-27 ENCOUNTER — Ambulatory Visit (HOSPITAL_BASED_OUTPATIENT_CLINIC_OR_DEPARTMENT_OTHER): Admitting: Specialist

## 2019-10-28 ENCOUNTER — Ambulatory Visit

## 2019-10-29 ENCOUNTER — Ambulatory Visit

## 2019-11-08 ENCOUNTER — Ambulatory Visit

## 2019-12-03 ENCOUNTER — Ambulatory Visit: Admitting: Obstetrics & Gynecology

## 2019-12-06 ENCOUNTER — Ambulatory Visit: Admitting: Obstetrics & Gynecology

## 2020-07-31 ENCOUNTER — Ambulatory Visit: Admitting: Obstetrics & Gynecology

## 2020-07-31 ENCOUNTER — Ambulatory Visit

## 2020-08-04 LAB — HX HPV HIGH RISK BY TMA
CASE NUMBER: 2022013004370
HX HPV HIGH RISK BY TMA: NOT DETECTED

## 2020-08-06 LAB — HX GYN FINAL REPORT
CASE NUMBER: 0
HX FINAL CYTOLOGIC INTERPRETATION: ABNORMAL
HX NOTE: 88141

## 2020-08-13 ENCOUNTER — Ambulatory Visit (HOSPITAL_BASED_OUTPATIENT_CLINIC_OR_DEPARTMENT_OTHER)

## 2020-08-13 ENCOUNTER — Ambulatory Visit

## 2020-08-13 NOTE — Progress Notes (Addendum)
 * * *    Hartman, Emma **DOB:** 1990-12-02 (30 yo F) **Acc No.** [id number] **DOS:**  08/13/2020    ---       Emma Hartman, Emma Hartman**    ------    43 Y old Female, DOB: [birth year]    Account Number: [id number]    [address] [place], [id number]    Home: [phone]    Guarantor: Emma Hartman Insurance: [employer]: [employer] ([place])    Referring: Emma Hartman External Visit ID: [id number]    Appointment Facility: [facility]        * * *    08/13/2020 Progress Notes: Emma Distance, MD    ------    ---       **Current Medications**    ---    Taking    * birth control pills     ---    Discontinued    * Macrobid 100 MG Capsule 1 capsule with food Orally every 12 hrs    ---    Medication List reviewed and reconciled with the patient    ---     Past Medical History    ---      Previous PCP: last done in [place].        ---    ....        ---    INFLUENZAE VACCINE: .        ---    DEPRESSION SCREENING: PHQ2-Neg.        ---    GC/CHLAMYDIA: 03/28/2018 neg.        ---    PAP: 03/28/18 LGSIL Women health follow up in one year; 03/19/19 neg,  01.03.2022.        ---    TDAP: 2018 thru Immigration process per patient.        ---    TOBACCO USE: former smoker.        ---      **Surgical History**    ---      tonsillectomy    ---     **Family History**    ---      Father: alive, diagnosed with Depression    ---    Mother: alive    ---    Sister: alive, healthy    ---    Paternal Grand Father: alive, diagnosed with Depression    ---    Paternal Grand Mother: alive, diagnosed with Depression    ---    Maternal Grand Father: deceased    ---    Maternal Grand Mother: alive, diagnosed with Cancer    ---    1 sister(s) - healthy.    ---     **Social History**    ---     _General:_    Marijuana: no.    Seat belts: always.    Guns in home: none.    Feels safe in home: yes.    Drugs: no.    Alcohol: alcohol occasional.    Children: none.    Education: BS/BA.    Marital Status: married.    Occupation: Research officer, political party.    Sexually  active: OCP's.    Tobacco Use: Status: former smoker, When did you stop smoking? started at the  age of 30 smoked for 5 yrs maybe 10-15 per day, Patient screened/cessation  counseled on: 0/26/2022.     **Allergies**    ---      N.K.D.A.    ---      **Hospitalization/Major Diagnostic Procedure**    ---  Denies Past Hospitalization    ---      **Review of Systems**    ---     _PAros_ :    CONSTITUTIONAL Denies Fever, Chills, Fatigue. Lost 9 lbs. OPHTHALMOLOGY Denies  redness, discharge or change in vision.Marland Kitchen ENT Denies Earache, Nasal congestion,  Sore throat.. RESPIRATORY Denies Cough, Shortness of breath, Wheezing.Marland Kitchen  CARDIOLOGY Denies Chest pain, Shortness of breath on exertion, Palpitations or  Edema.Marland Kitchen GASTROENTEROLOGY Denies Nausea, vomiting, abdominal pain, Diarrhea,  Constipation or Blood in stool.Marland Kitchen UROLOGY Denies Urinary Frequency, Burning  Sensation, Blood in urine or Flank pain.Marland Kitchen NEUROLOGY Denies Headache,  Dizziness, Paresthesias or Syncope.Emma Hartman Denies Joint pain, Joint  Swelling, Muscle Aches, Decreased Range of Motion.Marland Kitchen PSYCHOLOGY Denies Anxiety,  Depression, Suicidal or Homicidal Ideation.. DERMATOLOGY Denies Suspicious  Lesions or Rash.          **Reason for Appointment**    ---      1\. PE;LMOVM for screening..mf 08/12/20    ---      **History of Present Illness**    ---     _Depression Screening_ :    PHQ-9    Little interest or pleasure in doing things _Not at all_    Feeling down, depressed, or hopeless _Not at all_    Trouble falling or staying asleep, or sleeping too much _Not at all_    Feeling tired or having little energy _Not at all_    Poor appetite or overeating _Not at all_    Feeling bad about yourself-or that you are a failure or have let yourself or  your family down _Not at all_    Trouble concentrating on things, such as reading the newspaper or watching  television _Not at all_    Moving or speaking so slowly that other people could have noticed. Or  the  opposite- being so fidgety or restless that you have been moving around a lot  more than usual _Not at all_    Thoughts that you would be better off dead, or of hurting yourself in some  way? _Not at all_    Total Score: _0_    COVID-19 Pre-Screening Questions:    1\. Have you had the covid-19 vaccines? yes    2\. Are you sick? No.     _General_ :    Denies of any 27-year-old pleasant female who presented for annual physical but  she has no concerns or complaints today. She delivered a healthy baby girl  about 10 months ago and is doing fine.    PAP:07/21/2020, follows with OB/GYN recent Pap showed ASCUS    Tdap: Up-to-date.     _Adult Health Related Social Needs Questions_ :    DOMAIN: Housing    What is your housing situation today? _I have housing_    DOMAIN: Food/Utilities    In the past 4 weeks, have you or any family members you live with been unable  to get any of the following when it was really needed? _Food, No, Utilities:  (heat, electricity, etc), No_    DOMAIN: Transportation    Has lack of transportation kept you from medical appointments, meetings, work,  or from getting things needed for daily living? _No_    DOMAIN: Employment    Are you currently employed? If No, would you like help finding a job? _Yes_    DOMAIN: Experience of Violence    Do you feel physically and emotionally safe where you currently live? _Yes_    DOMAIN: Social Supports  How often do you see or talk to people that you care about and feel close to?  (For example: talking to friends on the phone, visiting friends or family,  going to church or club meetings) _More than 5 times a week_    Social of Determinants of Health Response:    Response: _Negative Screen_      **Vital Signs**    ---    Temp **97.6** , HR **92** , Weight Change -9 lb, RR **18** , BP **94/60** ,  Oxygen sat % **100** , Ht 62.25, Wt **121** , BMI **21.95**.      **Examination**    ---     _General Examination:_    GENERAL APPEARANCE: Well developed and  well nourished, alert and oriented, no  acute distress, pleasant, cooperative .    OROPHARYNX: moist mucous membranes, no lesions.    NECK: supple, no lymphadenopathy, no thyromegaly, no carotid bruits, normal  ROM, non-tender.    HEART: regular rate and rhythm, normal S1 & S2, no murmurs, rubs, gallops or  clicks.    LUNGS: clear to auscultation bilaterally, no rhonchi, rales or wheezes.    ABDOMEN: benign, soft, non-tender, no organomegaly, bowel sounds are normal,  no guarding, no hepatosplenomegaly, no masses palpated, no rebound, no  rigidity, no suprapubic tenderness, Abdominal bruit: no.    BACK: unremarkable, normal, normal range of motion of spine, no paraspinal  tenderness, normal alignment, no CVA tenderness, no tenderness.    EXTREMITIES: no edema/calves non-tender/no cord, pulses 2 plus bilaterally.    NEUROLOGIC EXAM: grossly non-focal exam, CN's II-XII grossly intact, DTR's 2+  & symmetric all 4 extremities, gait normal, normal sensation and strength.    SKIN: no rash, no atypical nevi.    MUSCULOSKELETAL: no weakness, no deformities.    PSYCHOLOGICAL: appropriate mood and affect .         **Assessments**    ---    1\. Annual physical exam - Z00.00 (Primary)    ---    2\. Atypical squamous cells of undetermined significance (ASC-US) on cervical  Pap smear - R87.610    ---      **Treatment**    ---      **1\. Annual physical exam**    _LAB: CBC w/ Diff_   Value Reference Range    ---------    RBC 3.86 L 3.90-5.20 - Mil/mm3    Hgb 12.1  11.8-16.0 - Gm/dL    ------------    Hct 37.5  36.0-47.0 - %    ------------    MCV 97.2  80.0-100.0 - fL    ------------    MCH 31.3  26.0-34.0 - pGm    ------------    MCHC 32.3  31.0-37.0 - Gm/dL    ------------    Platelet 315  150-400 - thous/mm3    ------------    RDW-SD 42.5  35.0-51.0 - fL    ------------    MPV 10.6  9.4-12.4 - fL    ------------    WBC 4.8  3.7-11.2 - thous/mm3    ------------      normal labs Emma Hartman,Emma Hartman 08/15/2020 8:39:03 AM > patient notified of  labs This lab was reviewed by Emma Hartman on 08/15/2020 at 08:44 AM  EST    ------    _LAB: Comprehensive Metabolic Panel_  Value Reference Range    ---------    Creatinine 0.584  0.550-1.300 - mg/dL    Sodium Lvl 161  096-045 - mmol/L    ------------  Potassium Lvl 4.5  3.6-5.2 - mmol/L    ------------    Chloride 110  98-110 - mmol/L    ------------    CO2 26  21-32 - mmol/L    ------------    Total Protein 7.2  6.0-8.4 - Gm/dL    ------------    Albumin Lvl 4.2  3.2-5.0 - Gm/dL    ------------    Calcium Lvl 8.8  8.5-10.5 - mg/dL    ------------    Glucose Lvl 92  70-110 - mg/dL    ------------    Bili Total 0.6  0.2-1.2 - mg/dL    ------------    Alk Phos 56  30-117 - Units/L    ------------    AST 11  6-40 - Units/L    ------------    ALT 19  6-55 - Units/L    ------------    Anion Gap 4  3-11 -    ------------    BUN 12  6-20 - mg/dL    ------------     normal labs Emma Drew,Emma Hartman 08/15/2020 8:39:03 AM > patient notified of  labs This lab was reviewed by Emma Hartman on 08/15/2020 at 08:44 AM  EST    ------    _LAB: Lipid Panel_  Value Reference Range    ---------    Chol 157  <=200 - mg/dL    HDL 73  >=66 - mg/dL    ------------    Trig 30  <=150 - mg/dL    ------------    LDL 78  <=130 - mg/dL    ------------    Status Fasting   \-    ------------     normal labs Emma Preston,Emma Hartman 08/15/2020 8:39:03 AM > patient notified of  labs This lab was reviewed by Emma Ill [place] on 08/15/2020 at 08:44 AM  EST    ------    _LAB: TSH Reflex Panel (Recommended)_  Value Reference Range    ---------    3rd Gen TSH 1.630  0.358-3.740 - mclU/ml     Emma [place],Emma Hartman 08/15/2020 8:39:03 AM > patient notified of labs This  lab was reviewed by Emma Hartman on 08/16/2020 at  12:23 PM EST    ------        Notes: Patient appears healthy, discussed healthy diet exercise and  maintaining normal BMI. Blood work requested will call with results follow-up  in one year or earlier if needed.        **2\. Atypical squamous cells of undetermined significance (ASC-US) on  cervical Pap smear**    Notes: Follows with OB/GYN.        **3\. Others**    Notes: Patient Educated with: Physical activity.pdf (Physical activity.pdf).     **Visit Codes**    ---      [phone] PREV MED VISIT EST PT 18-39 YR.    ---     **Follow Up**    ---    1 Year (Reason: px)    Electronically signed by Emma Hartman , MD on 08/13/2020 at 08:35 PM EST    Sign off status: Completed        * * [facility]    [address]    [place], [place] [address]    Tel: [phone]    Fax: [phone]              * * *          Progress Note: Emma Distance,  MD 08/13/2020    ---    Note generated by eClinicalWorks EMR/PM Software ([email or web address])

## 2020-08-14 ENCOUNTER — Ambulatory Visit

## 2020-08-14 LAB — HX LIPID PANEL
CASE NUMBER: 2022027000746
HX CHOL: 157 mg/dL — NL
HX HDL: 73 mg/dL — NL
HX LDL: 78 mg/dL — NL
HX TRIG: 30 mg/dL — NL

## 2020-08-14 LAB — HX CBC W/ DIFF
CASE NUMBER: 2022027000746
HX ABSOLUTE NRBC COUNT: 0 10*3/uL
HX HCT: 37.5 % — NL (ref 36.0–47.0)
HX HGB: 12.1 g/dL — NL (ref 11.8–16.0)
HX MCH: 31.3 pg — NL (ref 26.0–34.0)
HX MCHC: 32.3 g/dL — NL (ref 31.0–37.0)
HX MCV: 97.2 fL — NL (ref 80.0–100.0)
HX MPV: 10.6 fL — NL (ref 9.4–12.4)
HX NRBC PERCENT: 0 % — NL
HX PLATELET: 315 10*3/uL — NL (ref 150.0–400.0)
HX RBC: 3.86 10*6/uL — ABNORMAL LOW (ref 3.9–5.2)
HX RDW-CV: 11.9 % — NL (ref 11.5–14.5)
HX RDW-SD: 42.5 fL — NL (ref 35.0–51.0)
HX WBC: 4.8 10*3/uL — NL (ref 3.7–11.2)

## 2020-08-14 LAB — HX COMPREHENSIVE METABOLIC PANEL
CASE NUMBER: 2022027000746
HX ALBUMIN LVL: 4.2 g/dL — NL (ref 3.2–5.0)
HX ALKALINE PHOSPHATASE: 56 U/L — NL (ref 30.0–117.0)
HX ALT: 19 U/L — NL (ref 6.0–55.0)
HX ANION GAP: 4 — NL (ref 3.0–11.0)
HX AST: 11 U/L — NL (ref 6.0–40.0)
HX BILIRUBIN TOTAL: 0.6 mg/dL — NL (ref 0.2–1.2)
HX BUN: 12 mg/dL — NL (ref 6.0–20.0)
HX CALCIUM LVL: 8.8 mg/dL — NL (ref 8.5–10.5)
HX CHLORIDE: 110 mmol/L — NL (ref 98.0–110.0)
HX CO2: 26 mmol/L — NL (ref 21.0–32.0)
HX CREATININE: 0.584 mg/dL — NL (ref 0.55–1.3)
HX GLUCOSE LVL: 92 mg/dL — NL (ref 70.0–110.0)
HX POTASSIUM LVL: 4.5 mmol/L — NL (ref 3.6–5.2)
HX SODIUM LVL: 140 mmol/L — NL (ref 136.0–146.0)
HX TOTAL PROTEIN: 7.2 g/dL — NL (ref 6.0–8.4)

## 2020-08-14 LAB — HX .AUTOMATED DIFF
CASE NUMBER: 2022027000746
HX ABSOLUTE BASO COUNT: 0.02 10*3/uL — NL (ref 0.0–0.22)
HX ABSOLUTE EOS COUNT: 0.11 10*3/uL — NL (ref 0.0–0.45)
HX ABSOLUTE LYMPHS COUNT: 2.03 10*3/uL — NL (ref 0.74–5.04)
HX ABSOLUTE MONO COUNT: 0.38 10*3/uL — NL (ref 0.0–1.34)
HX ABSOLUTE NEUTRO COUNT: 2.25 10*3/uL — NL (ref 1.48–7.95)
HX BASOPHILS: 0.4 %
HX EOSINOPHILS: 2.3 %
HX IMMATURE GRANULOCYTES: 0.2 % — NL (ref 0.0–2.0)
HX LYMPHOCYTES: 42.3 %
HX MONOCYTES: 7.9 %
HX NEUTROPHILS: 46.9 %

## 2020-08-14 LAB — HX GLOMERULAR FILTRATION RATE (ESTIMATED)
CASE NUMBER: 2022027000746
HX AFN AMER GLOMERULAR FILTRATION RATE: >90
HX NON-AFN AMER GLOMERULAR FILTRATION RATE: >90

## 2020-08-14 LAB — HX TSH REFLEX PANEL (RECOMMENDED)
CASE NUMBER: 2022027000746
HX 3RD GEN TSH: 1.63 u[IU]/mL — NL (ref 0.358–3.74)

## 2020-09-10 ENCOUNTER — Ambulatory Visit: Admitting: Emergency Medicine

## 2020-09-10 ENCOUNTER — Ambulatory Visit

## 2020-09-11 LAB — HX COVID19 BY PCR (LGH)
CASE NUMBER: 2022054003234
HX COVID19 PCR: NOT DETECTED

## 2020-10-14 NOTE — Progress Notes (Addendum)
* * *        Emma Hartman, Emma Hartman**    --- ---    19 Y old Female, DOB: [birth year]    Account Number: [id number]    [address] [place], [id number]    Home: [phone]    Guarantor: Drema Balzarine Insurance: BCBS-[place]: BCBS ([place])    Referring: Carrolyn Meiers External Visit ID: [id number]    Appointment Facility: [facility]        * * *    06/28/2018  Progress Notes: Thereasa Distance, MD    --- ---    ---         **Current Medications**    ---    Taking     * birth control pills     ---    * Medication List reviewed and reconciled with the patient    ---      Past Medical History    ---       Previous PCP: last done in [place].        ---    ....        ---    INFLUENZAE VACCINE: .        ---    DEPRESSION SCREENING: PHQ2-Neg.        ---    GC/CHLAMYDIA: 03/28/2018 neg.        ---    PAP: 03/28/18 [facility] Women health follow up in one year.        ---    TDAP: 2018 thru Immigration process per patient.        ---    TOBACCO USE: former smoker.        ---       **Surgical History**    ---       tonsillectomy    ---      **Family History**    ---       Father: alive, diagnosed with Depression    ---    Mother: alive    ---    Sister: alive, healthy    ---    Paternal Grand Father: alive, Depression    ---    Paternal Grand Mother: alive, Depression    ---    Maternal Grand Father: deceased    ---    Maternal Grand Mother: alive, Cancer    ---    1 sister(s) - healthy.    ---      **Social History**    ---    Children: none.    Marijuana: no.    Education: BS/BA.    Seat belts: always.    Alcohol    alcohol _occasional_    Guns in home: none.    Tobacco Use    Status: _former smoker_    When did you stop smoking? _started at the age of 37 smoked for 5 yrs maybe  10-15 per day_    Patient screened/cessation counseled on: _12/11/2019_    Patient counseled on cessation: _12/11/2019_    Occupation: Research officer, political party.    Sexually active: OCP's.    Drugs: no.    Feels safe in home: yes.    Marital Status: married.       **Allergies**    ---       N.K.D.A.    ---       **Hospitalization/Major Diagnostic Procedure**    ---       No Hospitalization History.    ---       **Review  of Systems**    ---     _PAros_ :    CONSTITUTIONAL Denies Fever, Chills, Fatigue or Weight change.. OPHTHALMOLOGY  Denies redness, discharge or change in vision.Marland Kitchen ENT Denies Earache, Nasal  congestion, Sore throat.. RESPIRATORY Denies Cough, Shortness of breath,  Wheezing.Marland Kitchen CARDIOLOGY Denies Chest pain, Shortness of breath on exertion,  Palpitations or Edema.Marland Kitchen GASTROENTEROLOGY Denies Nausea, vomiting, abdominal  pain, Diarrhea, Constipation or Blood in stool.Marland Kitchen UROLOGY Denies Urinary  Frequency, Burning Sensation, Blood in urine or Flank pain.Marland Kitchen NEUROLOGY Denies  Headache, Dizziness, Paresthesias or Syncope.Deloria Lair Denies Joint  pain, Joint Swelling, Muscle Aches, Decreased Range of Motion.Marland Kitchen PSYCHOLOGY  Denies Anxiety, Depression, Suicidal or Homicidal Ideation.. DERMATOLOGY  Denies Suspicious Lesions or Rash.            **Reason for Appointment**    ---       1\. NP PE;pt confirmed apt ...mf 06/27/18    ---    2\. Was recently on antibiotic for abcess in mouth now has yeast infection and  discharge with odor    ---       **History of Present Illness**    ---     _Depression Screening_ :    PHQ-2 (2015 Edition)    Little interest or pleasure in doing things? _Not at all_    Feeling down, depressed, or hopeless? _Not at all_    Total Score _0_     _General_ :    Emma Hartman is a 30 year old pleasant female who presented to establish care and  annual physical. She has no significant past medical history.    She was recently on antibiotics for 2 Sets which is doing fine now however she  developed a vaginal discharge. She is going to try over-the-counter Monistat    PAP:2019    Tdap:2019    Flu Shot:at local Pharmacy.       **Vital Signs**    ---    BP 120/70, HR 86, Ht 62.25, Wt 112.6, BMI 20.43, RR 18, Oxygen sat % 100.       **Examination**    ---     _General  Examination_ :    GENERAL APPEARANCE: Well developed and well nourished, alert and oriented, no  acute distress, pleasant, cooperative .    OROPHARYNX: moist mucous membranes, no lesions.    NECK: supple, no lymphadenopathy, no thyromegaly, no carotid bruits, normal  ROM, non-tender.    HEART: regular rate and rhythm, normal S1 & S2, no murmurs, rubs, gallops or  clicks.    LUNGS: clear to auscultation bilaterally, no rhonchi, rales or wheezes.    ABDOMEN: benign, soft, non-tender, no organomegaly, bowel sounds are normal,  no guarding, no hepatosplenomegaly, no masses palpated, no rebound, no  rigidity, no suprapubic tenderness, Abdominal bruit: no.    BACK: unremarkable, normal, normal range of motion of spine, no paraspinal  tenderness, normal alignment, no CVA tenderness, no tenderness.    EXTREMITIES: no edema/calves non-tender/no cord, pulses 2 plus bilaterally.    NEUROLOGIC EXAM: grossly non-focal exam, CN's II-XII grossly intact, DTR's 2+  & symmetric all 4 extremities, gait normal, normal sensation and strength.    SKIN: no rash, no atypical nevi.    MUSCULOSKELETAL: no weakness, no deformities.    PSYCHOLOGICAL:  appropriate mood and affect .          **Assessments**    ---    1\. Annual physical exam - Z00.00 (Primary)    ---    2\. Vaginitis - N76.0    ---       **  Treatment**    ---       **1\. Annual physical exam**    _LAB: Urine Dip_   Glucose  neg       --- --- --- ---    Bilirubin  neg       --- --- --- ---    Ketones  neg       --- --- --- ---    Sp Gravity  1.015       --- --- --- ---    Blood  mod       --- --- --- ---    PH  6.0       --- --- --- ---    Protein  neg       --- --- --- ---    Katharine Look  0.2       --- --- --- ---    Nitrate  neg       --- --- --- ---    Leukocytes  mod       --- --- --- ---       This lab was reviewed by Thereasa Distance MD on 06/30/2018 at 19:00 PM EST    --- ---    _LAB: URINE HCG_ Negative    _LAB: CBC w/ Diff_ RBC  3.69  L  3.90-5.20 - Mil/mm3    --- --- --- ---     Hgb  11.2  L  11.8-16.0 - Gm/dL    --- --- --- ---    Hct  34.3  L  36.0-47.0 - %    --- --- --- ---    MCV  93.0    80.0-100.0 - fL    --- --- --- ---    MCH  30.4    26.0-34.0 - pGm    --- --- --- ---    MCHC  32.7    31.0-37.0 - Gm/dL    --- --- --- ---    Platelet  256    150-400 - thous/mm3    --- --- --- ---    RDW-SD  40.8    35.0-51.0 - fL    --- --- --- ---    MPV  10.9    9.4-12.4 - fL    --- --- --- ---    WBC  5.7    3.7-11.2 - thous/mm3    --- --- --- ---      Blood work normal except for mild anemia. Please add vitamin B12, folate,  total iron, total iron binding capacity and ferritin to patient's labs if  possible Deschene [place],Christine 07/05/2018 3:58:15 PM > labs added on for  patient This lab was reviewed by Adalberto Ill [place] on 07/05/2018 at 16:10  PM EST    --- ---    _LAB: Comprehensive Metabolic Panel_ Creatinine  0.577    0.550-1.300 - mg/dL    --- --- --- ---    Sodium Lvl  139    136-146 - mmol/L    --- --- --- ---    Potassium Lvl  4.0    3.6-5.2 - mmol/L    --- --- --- ---    Chloride  109    98-110 - mmol/L    --- --- --- ---    CO2  25    21-32 - mmol/L    --- --- --- ---    Total Protein  6.7    6.0-8.4 - Gm/dL    --- --- --- ---    Albumin Lvl  3.6  3.2-5.0 - Gm/dL    --- --- --- ---    Calcium Lvl  8.7    8.5-10.5 - mg/dL    --- --- --- ---    Glucose Lvl  80    70-110 - mg/dL    --- --- --- ---    Bili Total  0.6    0.2-1.2 - mg/dL    --- --- --- ---    Alk Phos  37    30-117 - Units/L    --- --- --- ---    AST  10    6-40 - Units/L    --- --- --- ---    ALT  17    6-55 - Units/L    --- --- --- ---    Anion Gap  5    3-11 -    --- --- --- ---    BUN  10    6-20 - mg/dL    --- --- --- ---      Blood work normal except for mild anemia. Please add vitamin B12, folate,  total iron, total iron binding capacity and ferritin to patient's labs if  possible Deschene [place],Christine 07/05/2018 3:58:15 PM > labs added on for  patient This lab was reviewed by Adalberto Ill [place] on  07/05/2018 at 16:10  PM EST    --- ---    _LAB: Lipid Panel_ Chol  189    <=200 - mg/dL    --- --- --- ---    HDL  108    >=40 - mg/dL    --- --- --- ---    Trig  75    <=150 - mg/dL    --- --- --- ---    LDL  66    <=130 - mg/dL    --- --- --- ---    Status  Done     \-    --- --- --- ---      Blood work normal except for mild anemia. Please add vitamin B12, folate,  total iron, total iron binding capacity and ferritin to patient's labs if  possible Deschene [place],Christine 07/05/2018 3:58:15 PM > labs added on for  patient This lab was reviewed by Adalberto Ill [place] on 07/05/2018 at 16:10  PM EST    --- ---    _LAB: TSH Reflex Panel (Recommended)_ 3rd Gen TSH  2.710    0.358-3.740 -  mclU/ml    --- --- --- ---      Blood work normal except for mild anemia. Please add vitamin B12, folate,  total iron, total iron binding capacity and ferritin to patient's labs if  possible Deschene Haring,Christine 07/05/2018 3:58:15 PM > labs added on for  patient This lab was reviewed by Adalberto Ill [place] on 07/05/2018 at 16:10  PM EST    --- ---        Notes: Appears healthy, discussed healthy diet exercising and getting him a  BMI blood work requested will call with results. A urine dip was done because  of the vaginal discharge which showed some blood and leukocytes. Patient is  not on her period. Will send for culture. Follow-up in one year or earlier if  needed.        **2\. Vaginitis**    _LAB: Urine Culture_   C Urine   Laboratory, [facility], [address],  [place], [place], [address]     \-    --- --- --- ---       This lab was reviewed  by Thereasa Distance MD on 06/30/2018 at 18:59 PM EST    --- ---         **3\. Others**    Notes: pt will bring her Birth control rx and we will prescribe similar.      **Procedure Codes**    ---       02542 Strip Urine Tests, manual w/out microscopy    ---    70623 URINE PREGNANCY TEST    ---       **Follow Up**    ---    1 Year    Electronically signed by Thereasa Distance MD , MD on  06/28/2018 at 01:21 PM EST    Sign off status: Completed        * * [facility]    [address]    [place], [place] [address]    Tel: [phone]    Fax: [phone]              * * *          Patient: Emma Hartman, Emma Hartman DOB: 1990/11/08 Progress Note: Thereasa Distance, MD  06/28/2018    ---    Note generated by eClinicalWorks EMR/PM Software ([email or web address])

## 2020-10-14 NOTE — Progress Notes (Addendum)
* * *        **  [name], Dalisa**    --- ---    74 Y old Female, DOB: [birth year]    [address], [place], [place] [address]    Home: [phone]    Provider: [name], Jahir Halt        * * *    Telephone Encounter    ---    Answered by   Sherian Rein, Christine  Date: 07/04/2018         Time: 09:23 AM    Message                      patient came in with copy of Birth control information sheet name of BC is Diane 35      patient would like results of urine culture can you please review.       patient also brought in her Immunization paperwork and needs titres done only tdap noted please order labs for patient, thank you she had her blood work done this morning for other testing ordered        --- ---            Action Taken                      Abuyog LPN,Donaveil   62/13/0865 11:03:08 AM > pt called inquiring about her birth control rx. pharmacy has not recvd this. pls advise      Colbe Viviano MD,Shatora Weatherbee  07/13/2018 9:09:30 PM > The OCP that Pt is requesting is not available in [place] and the combination has safety concerns. I know pt expressed her concerns about other OCP's . If she wants we can do a referral  for Gyn      Deschene [place],Christine  07/14/2018 10:47:07 AM > patient informed please schedule apn't with GYN for as soon as possible, thank you      [facility]  07/14/2018 10:57:12 AM > appointment with Dr. Janie Morning, OB-GYN, [address] [place], [place], [phone], on 08/07/18 @ 8:00 am      [name]  07/14/2018 10:57:28 AM > spoke to patient she is aware, also emailed the information                    * * *              * * *        ---         ---          * * *          Patient: Corl, Johnni DOB: [birth year] Provider: [name], Ethleen Lormand 07/04/2018    ---    Note generated by eClinicalWorks EMR/PM Software ([email or web address])

## 2020-10-14 NOTE — Progress Notes (Addendum)
* * *        **  [name], Ryland**    --- ---    69 Y old Female, DOB: November 14, 1990    [address], [place], [place] [address]    Home: [phone]    Provider: [name], Dina Mobley        * * *    Telephone Encounter    ---    Answered by   Sherian Rein, Christine  Date: 07/05/2018         Time: 03:59 PM    Reason   lab order    --- ---            Message                      add on labs sent to lab for patient per dr. Luisa Hart                    * * *              * * *        ---        Reason for Appointment    ---      1\. Lab order    ---      Assessments    ---    1\. Anemia - D64.9 (Primary)    ---      Treatment    ---       **1\. Anemia**    _LAB: Ferritin_    _LAB: Folate Level_    _LAB: Iron Binding Capacity Total_    _LAB: Iron Level_    _LAB: Vitamin B12 Level_    ---          * * *           Patient: Connelly, Lavaeh DOB: June 07, 1991 Provider: [name], Elysha Daw 07/05/2018    ---    Note generated by eClinicalWorks EMR/PM Software ([email or web address])

## 2020-10-14 NOTE — Progress Notes (Addendum)
* * *        **  [name], Alazia**    --- ---    86 Y old Female, DOB: Oct 20, 1990    [address], [place], [place] [address]    Home: [phone]    Provider: [name], Wilena Tyndall        * * *    Telephone Encounter    ---    Answered by   [name], Bryan Goin  Date: 06/30/2018         Time: 06:56 PM    Reason   UA+    --- ---            Message                      Called pt to discuss results, left message, urine is + for Ecoli , since pt was symptomatic rx for Macrobid sent                Refills  Start Macrobid Capsule, 100 MG, Orally, 14, 1 capsule with food,  every 12 hrs, 7 day(s), Refills=0    --- ---          * * *                ---          * * *          Patient: Crute, Merilynn DOB: 1991/07/08 Provider: [name], Melana Hingle 06/30/2018    ---    Note generated by eClinicalWorks EMR/PM Software ([email or web address])

## 2020-10-22 ENCOUNTER — Ambulatory Visit: Attending: Specialist

## 2020-10-22 ENCOUNTER — Encounter (INDEPENDENT_AMBULATORY_CARE_PROVIDER_SITE_OTHER): Admitting: Specialist

## 2020-10-22 ENCOUNTER — Encounter (INDEPENDENT_AMBULATORY_CARE_PROVIDER_SITE_OTHER)

## 2020-10-22 ENCOUNTER — Other Ambulatory Visit

## 2020-10-22 ENCOUNTER — Ambulatory Visit (INDEPENDENT_AMBULATORY_CARE_PROVIDER_SITE_OTHER): Admitting: Specialist

## 2020-10-22 DIAGNOSIS — R87618 Other abnormal cytological findings on specimens from cervix uteri: Secondary | ICD-10-CM

## 2020-10-22 LAB — POCT PREGNANCY, URINE: POC hCG Qual, Ur: NEGATIVE

## 2020-10-22 NOTE — Progress Notes (Signed)
 Patient ID: Emma Hartman is a 30 y.o. female with recent pap showing ASCUS with NEG HPV.  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    Colposcopy    Date/Time: 10/22/2020 4:16 PM  Performed by: Randell Loop, MD  Authorized by: Randell Loop, MD     Procedure location: cervix    Consent:     Patient questions answered: yes      Risks and benefits of the procedure and its alternatives discussed: yes      Procedural risks discussed:  Bleeding    Consent obtained:  Verbal and written    Consent given by:  Patient  Pre-procedure:     Speculum was placed in the vagina: yes      Prep solution(s): acetic acid    Procedure:     Colposcopy with: colposcopy only, cervical biopsy and endocervical curettage      Biopsy taken: yes      Colposcopy details:  Small AWE area at 6 oclock bx'd, ECC done    Cervix visibility: fully visualized      SCJ visibility: fully visualized      Ferric subsulfate solution applied: yes    Post-procedure:     Patient tolerance of procedure:  Patient tolerated the procedure well with no immediate complications    Estimated blood loss (mL):  0    Instructions and paperwork completed: yes      Educational handouts given: yes    Comments:      Pt with long hx abnormal pap smears on routine screening, most recent ASCUS/Neg HPV here for scheduled colposcopy of cervix.  Discussed etiology of abnl paps with HPV, spectrum of abnormalities and the pt's place on that spectrum, natural history of virus.  Discussed normal immune response, safer sex.  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 Pt agrees with plan.   Reviewed indications, risks, benefits, post-procedure instructions for colpo.  Consent obtained prior to procedure.  Urine pregnancy test: negative.  Adequate colposcopy with ECC done    Plan per path. Pt understands need for regular f/u, possibility of repeat colpo if disease seems to be progressing.  Plan LEEP for HSIL bx, otherwise rpt pap/HPV in 1y.

## 2020-10-24 LAB — TISSUE PATHOLOGY

## 2020-11-04 ENCOUNTER — Encounter (INDEPENDENT_AMBULATORY_CARE_PROVIDER_SITE_OTHER): Admitting: Specialist

## 2021-03-05 ENCOUNTER — Encounter (HOSPITAL_BASED_OUTPATIENT_CLINIC_OR_DEPARTMENT_OTHER)

## 2021-04-03 ENCOUNTER — Other Ambulatory Visit

## 2021-04-07 ENCOUNTER — Other Ambulatory Visit

## 2021-04-08 ENCOUNTER — Encounter

## 2021-04-08 ENCOUNTER — Encounter: Payer: BLUE CROSS/BLUE SHIELD | Attending: Obstetrics & Gynecology

## 2021-04-13 ENCOUNTER — Other Ambulatory Visit

## 2021-04-14 ENCOUNTER — Ambulatory Visit: Admit: 2021-04-14 | Discharge: 2021-04-14 | Payer: PRIVATE HEALTH INSURANCE | Attending: Obstetrics & Gynecology

## 2021-04-14 ENCOUNTER — Encounter (INDEPENDENT_AMBULATORY_CARE_PROVIDER_SITE_OTHER): Admitting: Obstetrics & Gynecology

## 2021-04-14 ENCOUNTER — Ambulatory Visit: Attending: Obstetrics & Gynecology | Admitting: Obstetrics & Gynecology

## 2021-04-14 VITALS — BP 100/60 | Wt 119.8 lb

## 2021-04-14 DIAGNOSIS — Z3009 Encounter for other general counseling and advice on contraception: Secondary | ICD-10-CM

## 2021-04-14 NOTE — Progress Notes (Signed)
 [facility]  [facility] [place]  [address]  [address]  [place] [address]  Dept: [phone]  Dept Fax: [phone]     Patient ID: Emma Hartman is a 30 y.o. G1P1001 who presents for contraception discussion    Subjective: Pt presents for contraception discussion  Married to husband  Interested in LARC method  On OCPs, sometimes missing pils    Problems:   Patient Active Problem List   Diagnosis   . ASCUS of cervix with negative high risk HPV   . History of abnormal cervical Pap smear   . Anemia   . Migraine     Current Medications:   Current Outpatient Medications   Medication Instructions   . desogestreL-ethinyl estradioL (Apri) 0.15-0.03 mg tablet 1 tablet, oral, Daily     Allergies:   Allergies   Allergen Reactions   . Amoxicillin Rash   . Augmentin [Amoxicillin-Pot Clavulanate] Rash     Other reaction(s): Itching skin     Past Medical History:   Past Medical History:   Diagnosis Date   . Breech presentation    . History of abnormal cervical Pap smear 10/22/2020     Past Surgical History:   Past Surgical History:   Procedure Laterality Date   . CESAREAN SECTION, LOW TRANSVERSE  10/24/2019   . TONSILLECTOMY N/A 2001     Family History: No family history on file.  Social History:   Social History     Tobacco Use   . Smoking status: Never Smoker   . Smokeless tobacco: Never Used   Substance Use Topics   . Alcohol use: Never   . Drug use: Never       Physical Exam:  BP 100/60 (BP Location: Left arm, Patient Position: Sitting, BP Cuff Size: Adult)   Wt 54.3 kg   BMI 20.56 kg/m   Appearance: Normal appearance.   Psychiatric:    Normal affect, behavior and judgment       Assessment/Plan:  30 y.o. G1P1001 for contraception discussion. Sometimes forgets to take OCPs so looking for more of a long term method. We rev Depo, Nuva Ring, LARC methods including Nexplanon and Kyleena/Mirena/Paragard IUDs. Rev risk/benfits of each method.  Thinking she wants to try IUD. We rev how its placed and removed. We discussed risks for perforation/expulsion. Rev efficacy and infection rates. Discussed she may have irreg bleeding initially ~ 6w-59mths then possibly no menses with progestin IUD. Discussed potential for heavier, crampier MP with paragard IUD. She would like IUD but not sure which one yet. Pamphlets given. No questions. Will plan for insertion in 2 wks when she has menses. Urine GP sent. Will follow up then    Janie Morning, NP

## 2021-04-15 LAB — GC/CT (AMP DNA)
CT DNA: NOT DETECTED
GC DNA: NOT DETECTED

## 2021-04-29 ENCOUNTER — Other Ambulatory Visit

## 2021-05-03 ENCOUNTER — Other Ambulatory Visit

## 2021-05-04 ENCOUNTER — Encounter (INDEPENDENT_AMBULATORY_CARE_PROVIDER_SITE_OTHER): Admitting: Obstetrics & Gynecology

## 2021-05-04 ENCOUNTER — Encounter (INDEPENDENT_AMBULATORY_CARE_PROVIDER_SITE_OTHER)

## 2021-05-04 ENCOUNTER — Other Ambulatory Visit

## 2021-05-04 ENCOUNTER — Ambulatory Visit: Admit: 2021-05-04 | Discharge: 2021-05-04 | Payer: PRIVATE HEALTH INSURANCE | Attending: Obstetrics & Gynecology

## 2021-05-04 VITALS — BP 118/68 | Ht 64.0 in | Wt 120.8 lb

## 2021-05-04 DIAGNOSIS — Z30430 Encounter for insertion of intrauterine contraceptive device: Secondary | ICD-10-CM

## 2021-05-04 LAB — POCT PREGNANCY, URINE: POC hCG Qual, Ur: NEGATIVE

## 2021-05-04 MED ORDER — copper (Paragard) 380 square mm IUD 1 each
380 | Freq: Once | INTRAUTERINE | Status: AC
Start: 2021-05-04 — End: 2021-05-04
  Administered 2021-05-04: 13:00:00 1 via INTRAUTERINE

## 2021-05-04 MED FILL — COPPER 380 SQUARE MM INTRAUTERINE DEVICE: 380 380 square mm | INTRAUTERINE | Qty: 1

## 2021-05-04 NOTE — Progress Notes (Signed)
 Patient ID: Emma Hartman is a 30 y.o. female g1p1001 for IUD insertion   Pt presents requesting IUD insertion, has been counseled regarding the indications, risks, benefits of IUD vs other options.  In monogamous relationship, no Hx PID or recent STD.  Pap up to date (due 10/2021). GP neg. Discussed indications, risks/benefits, alternatives, s/sx infection or expulsion, when to call. Risk of perforation, expulsion, failure, ectopic discussed. Pt aware of other options, not interested in childbearing in near future, opts for IUD.  Reviewed Paragard nonhormonal IUD vs hormonal IUD, advantages/disadvantages.   Pt opts for Paragard hormonal IUD. Consent obtained.     IUD Insertion    Date/Time: 05/04/21 8:18 AM  Performed by: Janie Morning, NP  Authorized by: Janie Morning, NP     Consent:     Consent obtained:  Verbal    Consent given by:  Patient    Procedure risks and benefits discussed: yes      Patient questions answered: yes      Patient agrees, verbalizes understanding, and wants to proceed: yes      Educational handouts given: yes      Instructions and paperwork completed: yes    Procedure:     Pelvic exam performed: yes      Negative GC/chlamydia test: yes      Negative urine pregnancy test: yes      Negative serum pregnancy test: no      Cervix cleaned and prepped: yes      Paracervical block:  no    Anesthesia type & volume:  Hurricane spray used     Speculum placed in vagina: yes      Tenaculum applied to cervix: yes      Uterus sounded: yes      Uterus sound depth (cm):  7    IUD inserted with no complications: yes      IUD type:  Paragard     Strings trimmed: yes      Ultrasound guidance:  No  Post-procedure:     Patient tolerated procedure well: yes      Patient will follow up after next period: no        Lot # [id number]  Exp 10/2025     29yo g1p1 for Paragard IUD insertion. Understands approval is now for replacement in 10 yrs.   Reviewed s/sx of complications/infection, perforation, expulsion reviewed-pt will  return or call for any problems.  Nothing in vagina for 2 wks.  Aware she may have heavier, crampier menses. String checks reviewed with pt. Follow up due in April 2023, aware due for pap at that time, will follow up then.        Janie Morning, NP

## 2021-06-09 ENCOUNTER — Encounter (INDEPENDENT_AMBULATORY_CARE_PROVIDER_SITE_OTHER): Admitting: Family

## 2021-06-09 NOTE — Progress Notes (Signed)
 Reminder set for pap due in April 2023

## 2021-08-10 ENCOUNTER — Other Ambulatory Visit

## 2021-08-17 ENCOUNTER — Other Ambulatory Visit

## 2021-08-17 ENCOUNTER — Encounter (INDEPENDENT_AMBULATORY_CARE_PROVIDER_SITE_OTHER)

## 2021-08-17 ENCOUNTER — Ambulatory Visit: Admit: 2021-08-17 | Discharge: 2021-08-17 | Payer: PRIVATE HEALTH INSURANCE

## 2021-08-17 VITALS — BP 100/60 | HR 88 | Resp 18 | Ht 64.02 in | Wt 116.0 lb

## 2021-08-17 DIAGNOSIS — Z0000 Encounter for general adult medical examination without abnormal findings: Secondary | ICD-10-CM

## 2021-08-17 NOTE — Addendum Note (Signed)
Addended by: Adalberto Ill on: 08/17/2021 08:26 AM     Modules accepted: Orders

## 2021-08-17 NOTE — Progress Notes (Signed)
[facility]  [facility]  [address]  [address]  [place] [address]  Dept: [phone]  Dept Fax: [phone]     Patient ID: Emma Hartman is a 31 y.o. female who presents for Annual Physical.    Subjective   HPI  Kenyanna is a 31 year old pleasant female who presented for Annual Physical. She has no concerns or complaints today.   Pap: H/o abnormal pap, follow ing with GYN  Tdap: Uptodate    Patient Active Problem List   Diagnosis   . ASCUS of cervix with negative high risk HPV   . History of abnormal cervical Pap smear   . Anemia   . Migraine     No current outpatient medications  Allergies   Allergen Reactions   . Amoxicillin Rash   . Augmentin [Amoxicillin-Pot Clavulanate] Rash     Other reaction(s): Itching skin     Past Medical History:   Diagnosis Date   . Breech presentation    . History of abnormal cervical Pap smear 10/22/2020     Past Surgical History:   Procedure Laterality Date   . CESAREAN SECTION, LOW TRANSVERSE  10/24/2019   . TONSILLECTOMY N/A 2001     Family History   Problem Relation Name Age of Onset   . Depression Father     . Cancer Maternal Grandmother       Social History     Tobacco Use   . Smoking status: Former     Types: Cigarettes   . Smokeless tobacco: Never   Vaping Use   . Vaping Use: Never used   Substance Use Topics   . Alcohol use: Yes     Comment: occasional   . Drug use: Never       Objective   Review of Systems   Constitutional: Negative for chills and fever.   HENT: Negative for ear pain and sinus pain.    Eyes: Negative for blurred vision.   Respiratory: Negative for cough and wheezing.    Cardiovascular: Negative for chest pain and palpitations.   Gastrointestinal: Negative for abdominal pain, nausea and vomiting.   Genitourinary: Negative for dysuria.   Musculoskeletal: Negative for joint pain.   Skin: Negative for rash.   Neurological: Negative for dizziness and headaches.   Psychiatric/Behavioral: Negative for suicidal ideas.      Visit Vitals  BP 100/60   Pulse 88   Resp 18   Ht 1.626 m   Wt 52.6 kg   SpO2 100%   BMI 19.90 kg/m   OB Status Having periods   BSA 1.54 m       Physical Exam  Constitutional:       General: She is not in acute distress.     Appearance: Normal appearance.   HENT:      Head: Normocephalic and atraumatic.      Right Ear: Tympanic membrane normal.      Left Ear: Tympanic membrane normal.      Nose: Nose normal.      Mouth/Throat:      Mouth: Mucous membranes are moist.   Eyes:      Extraocular Movements: Extraocular movements intact.      Pupils: Pupils are equal, round, and reactive to light.   Cardiovascular:      Rate and Rhythm: Normal rate and regular rhythm.      Pulses: Normal pulses.      Heart sounds: Normal  heart sounds.   Pulmonary:      Effort: Pulmonary effort is normal.      Breath sounds: Normal breath sounds. No wheezing.   Abdominal:      General: Bowel sounds are normal.      Palpations: Abdomen is soft.      Tenderness: There is no abdominal tenderness.   Musculoskeletal:         General: No swelling or tenderness. Normal range of motion.      Cervical back: Normal range of motion and neck supple. No tenderness.   Lymphadenopathy:      Cervical: No cervical adenopathy.   Skin:     General: Skin is warm.      Findings: No lesion or rash.   Neurological:      General: No focal deficit present.      Mental Status: She is alert and oriented to person, place, and time. Mental status is at baseline.      Cranial Nerves: No cranial nerve deficit.      Gait: Gait normal.   Psychiatric:         Mood and Affect: Mood normal.         Behavior: Behavior normal.         Assessment/Plan   Annual physical exam  Appears healthy, discussed healthy diet exercise and maintaining normal BMI.  Blood work results reviewed from last year. Pt opted to skip blood work this year.  Age-appropriate counseling done and follow-up discussed.  Follow-up earlier if needed

## 2021-10-07 ENCOUNTER — Telehealth (INDEPENDENT_AMBULATORY_CARE_PROVIDER_SITE_OTHER): Admitting: Family

## 2021-10-07 ENCOUNTER — Encounter (INDEPENDENT_AMBULATORY_CARE_PROVIDER_SITE_OTHER): Admitting: Obstetrics & Gynecology

## 2021-10-07 NOTE — Telephone Encounter (Signed)
Please send pap reminder letter

## 2021-10-09 ENCOUNTER — Ambulatory Visit: Admitting: Women's Health

## 2021-10-09 ENCOUNTER — Encounter (INDEPENDENT_AMBULATORY_CARE_PROVIDER_SITE_OTHER): Admitting: Women's Health

## 2021-10-09 ENCOUNTER — Other Ambulatory Visit

## 2021-10-09 ENCOUNTER — Ambulatory Visit: Admit: 2021-10-09 | Discharge: 2021-10-09 | Payer: PRIVATE HEALTH INSURANCE | Attending: Women's Health

## 2021-10-09 VITALS — Wt 117.0 lb

## 2021-10-09 DIAGNOSIS — N3001 Acute cystitis with hematuria: Secondary | ICD-10-CM

## 2021-10-09 MED ORDER — nitrofurantoin (Macrodantin) 100 mg capsule
100 | ORAL_CAPSULE | Freq: Two times a day (BID) | ORAL | 0 refills | 6.00000 days | Status: AC
Start: 2021-10-09 — End: 2021-10-14

## 2021-10-09 NOTE — Progress Notes (Signed)
[facility]  [facility] [place]  [address]  [address]  [place] [address]  Dept: [phone]  Dept Fax: [phone]     Patient ID: Emma Hartman is a 31 y.o. G1P1001 who presents for problem visit    Subjective: Pt presents for problem visit. Reports pelvic pressure and low abd bloating x 5 days. No fever, no dysuria or frequency. No VB or d/c.   Has Paraguard in place since 04/2021. States her MP are regular and not too heavy or crampy. She is happy with the paraguard    Due for pap. Last pap 07/2020 ASCUS/HPV Neg. Will collect today.      Problems:   Patient Active Problem List   Diagnosis   . ASCUS of cervix with negative high risk HPV   . History of abnormal cervical Pap smear   . Anemia   . Migraine     Current Medications:   Current Outpatient Medications   Medication Instructions   . copper (Paragard) 380 square mm IUD 1 each, intrauterine, Once     Allergies:   Allergies   Allergen Reactions   . Amoxicillin Rash   . Augmentin [Amoxicillin-Pot Clavulanate] Rash     Other reaction(s): Itching skin     Past Medical History:   Past Medical History:   Diagnosis Date   . Breech presentation    . History of abnormal cervical Pap smear 10/22/2020     Past Surgical History:   Past Surgical History:   Procedure Laterality Date   . CESAREAN SECTION, LOW TRANSVERSE  10/24/2019   . TONSILLECTOMY N/A 2001     Physical Exam:  Wt 53.1 kg   LMP 09/18/2021 (Approximate)   BMI 20.07 kg/m   Appearance: Normal appearance.   Abdomen: Soft, nontender/nondistended.   Genitourinary:     General: Normal vulva.      Vagina: Normal, no abnl discharge.     Cervix: No cervical motion tenderness. IUD strings visible. Pap collected. GP off thin prep  Psychiatric: Normal affect, behavior and judgment  UA dip in office + for leuks, blood, and protein    Dr available for any questions or concerns.        Assessment/Plan:  31 y.o. G1P1001 for GYN problem  visit  Likely UTI based on sx and urine dip. Will treat for simple cystitis. Rx sent for Macrobid. Will call if symptoms worsen or no improvement   Paraguard IUD strings visualized. No bleeding.    Last pap 07/2020 ASCUS/HPV Neg.  Pap collected.  GP off thin prep. Pt will still schedule annual visit and f/u as needed    Marye Round NP

## 2021-10-11 LAB — URINE CULTURE: Urine Culture: NO GROWTH

## 2021-10-15 LAB — GC/CT (AMP DNA)
CT DNA: NOT DETECTED
GC DNA: NOT DETECTED

## 2021-10-21 LAB — PAP SMEAR

## 2021-10-22 LAB — HPV HIGH RISK DNA (PERFORMABLE): HPV, High-Risk: NOT DETECTED

## 2021-10-22 NOTE — Telephone Encounter (Signed)
Hi [name]. Can you please send this pt a letter to f/u for pap in 1 year? Thank you, Fannie KneeSue

## 2021-10-28 ENCOUNTER — Encounter: Payer: PRIVATE HEALTH INSURANCE | Attending: Obstetrics & Gynecology

## 2021-11-12 ENCOUNTER — Ambulatory Visit
Admit: 2021-11-12 | Discharge: 2021-11-13 | Payer: PRIVATE HEALTH INSURANCE | Attending: Women's Health | Primary: Family Medicine

## 2021-11-12 DIAGNOSIS — Z01419 Encounter for gynecological examination (general) (routine) without abnormal findings: Secondary | ICD-10-CM

## 2021-11-12 NOTE — Progress Notes (Signed)
[facility]  [facility] [place]  [address]  [address]  [place] [address]  Dept: [phone]  Dept Fax: [phone]     Patient ID: Emma Hartman is a 31 y.o. G1P1001 who presents for Annual GYN Exam    Subjective: Pt presents for routine Gyn care. Has Paraguard in place since 04/2021. Menses regular, not too heavy or crampy. Denies any GYN sx. Was seen by me on 10/09/21 for UTI sx and treated with Macrobid. Culture was Neg, but pt's sx resolved with tx. Pt was due for pap at that time. Pap and GP done on 10/09/21. 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      Married. Feels safe at home      Denies smoking, ETOH. No drug use    Problems:   Patient Active Problem List   Diagnosis   . ASCUS of cervix with negative high risk HPV   . History of abnormal cervical Pap smear   . Anemia   . Migraine     Current Medications:   Current Outpatient Medications   Medication Instructions   . copper (Paragard) 380 square mm IUD 1 each, intrauterine, Once     Allergies:   Allergies   Allergen Reactions   . Amoxicillin Rash   . Augmentin [Amoxicillin-Pot Clavulanate] Rash     Other reaction(s): Itching skin     Past Medical History:   Past Medical History:   Diagnosis Date   . Breech presentation    . History of abnormal cervical Pap smear 10/22/2020     Past Surgical History:   Past Surgical History:   Procedure Laterality Date   . CESAREAN SECTION, LOW TRANSVERSE  10/24/2019   . TONSILLECTOMY N/A 2001     Family History:   Family History   Problem Relation Name Age of Onset   . Depression Father     . Cancer Maternal Grandmother       Social History:   Social History     Tobacco Use   .  Smoking status: Former     Types: Cigarettes   . Smokeless tobacco: Never   Vaping Use   . Vaping status: Never Used   Substance Use Topics   . Alcohol use: Yes     Comment: occasional   . Drug use: Never       Physical Exam:  BP 102/60   Ht 1.6 m   Wt 53.1 kg   BMI 20.73 kg/m    Declined Chaperone  Appearance: Normal appearance.   HENT: Normocephalic and atraumatic.   Lungs: Normal respiratory effort  Breasts: Breasts are symmetrical. No masses, tenderness or nipple discharge. No axillary lymphadenopathy.  Abdomen: Soft, nontender/nondistended. No masses on palpation.  Genitourinary: Exam deferred- done 10/09/21  Musculoskeletal:    Normal range of motion.   Skin: Warm and dry.   Neurological: No gross neurological deficits  Psychiatric: Normal affect, behavior and judgment    Dr Serita Grit available for any questions  or concerns.       Assessment/Plan:  31 y.o. G1P1001 for Routine Annual Gyn Exam. Denies any GYN or menstrual complaints at this time.  No significant changes in PMH, PSH, Rx, All, FH or SH.  Routine preventative measures reviewed including monthly SBE, diet, weight-bearing exercise, calcium supplementation.    Discussed Mammograms annually starting at age 58, colonoscopies age 17 or per PCP.  Denies DV or substance use issues.       Paraguard in place since 04/2021. Due for removal 04/2031. Happy with her IUD. No issues. 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 ASCUS/HPV Neg. Plan to repeat in 1 year.  F/U annually or sooner if needed.  Letter to PCP    Marye Round NP

## 2021-11-13 NOTE — Telephone Encounter (Signed)
Noted.

## 2021-11-13 NOTE — Telephone Encounter (Signed)
Pt called to cancel apt on May 17 for a transfer apt from Dr Luisa HartAujla resch to Sept 1

## 2021-12-02 ENCOUNTER — Encounter

## 2021-12-02 ENCOUNTER — Encounter: Payer: PRIVATE HEALTH INSURANCE | Attending: Family Medicine | Primary: Family Medicine

## 2022-02-16 ENCOUNTER — Ambulatory Visit: Admit: 2022-02-16 | Discharge: 2022-02-16 | Attending: Family Medicine | Primary: Family Medicine

## 2022-02-16 DIAGNOSIS — E569 Vitamin deficiency, unspecified: Secondary | ICD-10-CM

## 2022-02-16 MED ORDER — cholecalciferol (Vitamin D-3) 50 MCG (2000 UT) tablet
50 | ORAL_TABLET | Freq: Every day | ORAL | 3 refills | 60.00000 days | Status: AC
Start: 2022-02-16 — End: ?

## 2022-02-16 NOTE — Progress Notes (Signed)
-   Plz give ROS, thanks.// Done  - Plz give a healthy lifestyle sheet, thanks.// Done  - Plz ask if there is any issue with alcohol or substance use, thanks.// No  - Plz give gown but don't change yet, thanks.// Done  - Please ask if pt would like STI test, thanks.// Declined  - Plz give Tdap if there is no concern, thanks.// Declined, got it two years ago when she was pregnant

## 2022-02-16 NOTE — Progress Notes (Signed)
[facility]  [address]  [address]  [place] [address]    Patient ID: Emma Hartman is a 31 y.o. female who presents for Establish Care (SEE PROGRESS NOTE FOR CCL.).    - Plz give ROS, thanks.// Done  - Plz give a healthy lifestyle sheet, thanks.// Done  - Plz ask if there is any issue with alcohol or substance use, thanks.// No  - Plz give gown but don't change yet, thanks.// Done  - Please ask if pt would like STI test, thanks.// Declined  - Plz give Tdap if there is no concern, thanks.// Declined, got it two years ago when she was pregnant    Subjective   HPI    Pt reports sleeping is good.   Pt needs depression screening.   Pt is not taking any supplement.     ROS: See HPI.    Past Medical History  She has a past medical history of Breech presentation and History of abnormal cervical Pap smear (10/22/2020).    Medications    Current Outpatient Medications:   .  copper (Paragard) 380 square mm IUD, 1 each by intrauterine route 1 (one) time., Disp: , Rfl:   .  cholecalciferol (Vitamin D-3) 50 MCG (2000 UT) tablet, Take 1 tablet (50 mcg) by mouth once daily., Disp: 90 tablet, Rfl: 3    Medications      Start Medication Dose/Rate, Route, Frequency Ordered Stop    02/16/22 0000 cholecalciferol (Vitamin D-3) 50 MCG (2000 UT) tablet         50 mcg, oral, Daily 02/16/22 1626 --                Surgical History  She has a past surgical history that includes Cesarean section, low transverse (10/24/2019) and Tonsillectomy (N/A, 2001).     Social History  She reports that she has quit smoking. Her smoking use included cigarettes. She has never used smokeless tobacco. She reports current alcohol use. She reports that she does not use drugs.    Family History  Family History   Problem Relation Name Age of Onset   . Depression Father     . Cancer Maternal Grandmother          Advance Care Plan  No emergency contact information on file.  No Order       Allergies  Amoxicillin and Augmentin  [amoxicillin-pot clavulanate]    Objective   Visit Vitals  BP 108/64 (BP Location: Right arm, Patient Position: Sitting, BP Cuff Size: Adult)   Pulse 89   Wt 53.4 kg   SpO2 99%   BMI 20.87 kg/m   OB Status Having periods   BSA 1.54 m       Physical Exam     Psy: normal mood and affect, good eye contact, normal speech, no delusions, no flight of ideas, no thought disorder, no SI/HI, not tangential, oriented x 3.    Assessment/Plan   Deetta was seen today for establish care.  Vitamin deficiency  -     cholecalciferol (Vitamin D-3) 50 MCG (2000 UT) tablet; Take 1 tablet (50 mcg) by mouth once daily.  Screening for depression  Comments:  Patient Health Questionnaire-2 Score: 0 Interpretation: Negative screening.      Follow-up & Interventions: Maintain annual screening - No additional Follow-up      Patient Health Questionnaire-2 Score: 0 Interpretation: Negative screening.      Follow-up & Interventions: Maintain annual screening -  No additional Follow-up required  Follow up for Jan 2024 for PE.

## 2022-07-07 ENCOUNTER — Inpatient Hospital Stay
Admit: 2022-07-07 | Disposition: A | Discharge status: Home / Self Care | Attending: Adult Health | Primary: Family Medicine

## 2022-07-07 DIAGNOSIS — H1033 Unspecified acute conjunctivitis, bilateral: Secondary | ICD-10-CM

## 2022-07-07 MED ORDER — polymyxin B-trimethoprim (Polytrim) ophthalmic solution
10000 | OPHTHALMIC | 0 refills | 7.00000 days | Status: AC
Start: 2022-07-07 — End: 2022-07-14

## 2022-07-07 NOTE — Discharge Instructions (Addendum)
Medications as prescribed-proper administration and side effect profile discussed with patient/family  Over-the-counter as needed for symptom relief  Return to clinic as needed or follow-up with your PCP  ED if symptoms worsen/become severe

## 2022-07-07 NOTE — ED Provider Notes (Signed)
History  Chief Complaint   Patient presents with   . Eye Problem     Pt reports bilateral eye discharge for the past 2 days.     31 year old female presents urgent care with bilateral eye irritation and discharge for the past 2 days.  Being seen with her 37-year-old daughter who is exhibiting similar symptoms.  No foreign body sensation, no URI symptoms.      History provided by:  Patient  Language interpreter used: No        Past Medical History:   Diagnosis Date   . Breech presentation ([facility]CC)    . History of abnormal cervical Pap smear 10/22/2020       Past Surgical History:   Procedure Laterality Date   . CESAREAN SECTION, LOW TRANSVERSE  10/24/2019   . TONSILLECTOMY N/A 2001       Family History   Problem Relation Name Age of Onset   . Depression Father     . Cancer Maternal Grandmother         Social History     Tobacco Use   . Smoking status: Former     Types: Cigarettes     Quit date: 2016     Years since quitting: 7.9   . Smokeless tobacco: Never   Vaping Use   . Vaping Use: Never used   Substance Use Topics   . Alcohol use: Yes     Comment: occasional   . Drug use: Never       Review of Systems   All other systems reviewed and are negative.      Physical Exam  Vitals:    07/07/22 1846 07/07/22 1848   BP:  109/73   BP Location:  Right arm   Patient Position:  Sitting   Pulse:  76   Resp:  16   Temp:  36.7 C (98 F)   TempSrc:  Oral   SpO2:  98%   Weight: 54.4 kg    Height: 1.626 m        Physical Exam  Vitals and nursing note reviewed.   Constitutional:       Appearance: Normal appearance.   HENT:      Head: Normocephalic and atraumatic.      Right Ear: Tympanic membrane normal.      Left Ear: Tympanic membrane normal.      Nose: Nose normal.      Mouth/Throat:      Mouth: Mucous membranes are moist.   Eyes:      Comments: Bilateral conjunctiva is mildly injected without purulent drainage   Cardiovascular:      Rate and Rhythm: Normal rate.      Pulses: Normal pulses.   Pulmonary:      Effort: Pulmonary  effort is normal.   Musculoskeletal:         General: Normal range of motion.      Cervical back: Normal range of motion and neck supple.   Lymphadenopathy:      Cervical: No cervical adenopathy.   Skin:     General: Skin is warm and dry.      Capillary Refill: Capillary refill takes less than 2 seconds.   Neurological:      Mental Status: She is alert and oriented to person, place, and time.              No orders to display     Labs Reviewed - No data to display  Procedures  Procedures    UC Course  Diagnoses as of 07/07/22 2024   Acute bacterial conjunctivitis of both eyes       Medical Decision Making  Well-appearing 31 year old female in no acute distress, physical exam findings noted for bilateral conjunctiva injection, Polytrim sent to pharmacy.  Good handwashing follow-up with PCP return to clinic ED if symptoms worsen/become severe.    Acute bacterial conjunctivitis of both eyes: acute illness or injury  Risk  Prescription drug management.          Discharge Meds  ED Prescriptions     Medication Sig Dispense Start Date End Date Auth. Provider    polymyxin B-trimethoprim (Polytrim) ophthalmic solution Administer 1 drop into both eyes every 4 (four) hours for 7 days. 3 mL 07/07/2022 07/14/2022 Lloyd Huger, NP          Home Meds  Prior to Admission medications    Medication Sig Start Date End Date Taking? Authorizing Provider   cholecalciferol (Vitamin D-3) 50 MCG (2000 UT) tablet Take 1 tablet (50 mcg) by mouth once daily. 02/16/22  Yes Chong So, DO   copper (Paragard) 380 square mm IUD 1 each by intrauterine route 1 (one) time.    Historical Provider, MD   polymyxin B-trimethoprim (Polytrim) ophthalmic solution Administer 1 drop into both eyes every 4 (four) hours for 7 days. 07/07/22 07/14/22  Lloyd Huger, NP         Total amount of time spent on day of service doing chart review, history and physical exam, order/ review results of testing ordered (if any), patient counseling, documentation: >30  minutes.      Patient encounter note may have been created using voice recognition software and in real time during the office visit. Please excuse any typographical errors that may not have been edited out.            Lloyd Huger, NP  07/07/22 2024

## 2022-08-10 ENCOUNTER — Ambulatory Visit: Admit: 2022-08-10 | Discharge: 2022-08-10 | Attending: Family Medicine | Primary: Family Medicine

## 2022-08-10 DIAGNOSIS — Z0000 Encounter for general adult medical examination without abnormal findings: Secondary | ICD-10-CM

## 2022-08-10 MED ORDER — inhalational spacing device inhaler
0 refills | Status: AC
Start: 2022-08-10 — End: ?

## 2022-08-10 MED ORDER — albuterol (Ventolin HFA) 90 mcg/actuation inhaler
90 | Freq: Four times a day (QID) | RESPIRATORY_TRACT | 1 refills | Status: AC | PRN
Start: 2022-08-10 — End: ?

## 2022-08-10 NOTE — Progress Notes (Signed)
-   Plz give ROS, thanks.// Done  - Plz give a healthy lifestyle sheet, thanks.// Done  - Plz ask if there is any issue with alcohol or substance use, thanks.// None  - Plz ask pt if refill of med(s) is needed, thanks.// None needed  - Plz give gown but don't change yet, thanks.// Done  - Plz ask if she would like breast exam today, thanks.// Declined, had one recently by OB/GYN  - Please ask if pt would like STI test, thanks.// Yes  - Plz give age-appropriate flu vaccine if there is no concern, thanks.// Given  - Plz ask pt if she had a gyn appt in past 12 months or has appt coming up, thanks.// Last year

## 2022-08-10 NOTE — Progress Notes (Signed)
Outpatient  H&P  [facility]  [address]  [place] [address]  [phone]    Today's Date: 08/10/2022  MRN: [id number]  Name: Emma Hartman  DOB: [birth year]    Chief Complaint  Chief Complaint   Patient presents with   . Annual Exam     - Plz give ROS, thanks.// Done  - Plz give a healthy lifestyle sheet, thanks.// Done  - Plz ask if there is any issue with alcohol or substance use, thanks.// None  - Plz ask pt if refill of med(s) is needed, thanks.// None needed  - Plz give gown but don't change yet, thanks.// Done  - Plz ask if she would like breast exam today, thanks.// Declined, had one recently by OB/GYN  - Please ask if pt would like STI test, thanks.// Yes  - Plz give age-appropriate flu vaccine if there is no concern, thanks.// Given  - Plz ask pt if she had a gyn appt in past 12 months or has appt coming up, thanks.// Last year    History Of Present Illness  Emma Hartman is a 32 y.o. female presenting for PE.     Couple of months, intermittent, mostly at night, last for about 2 minutes, no SOB or chest pain, no h/o asthma. No decrease in energy level. Coughing started Nov 2023.     No sour/metallic taste in am, rare acid reflux.     Pt reports history of allergy - but no recent sneezing. Last spring    Past Medical History  She has a past medical history of Breech presentation ([facility]) and History of abnormal cervical Pap smear (10/22/2020).    Surgical History  She has a past surgical history that includes Cesarean section, low transverse (10/24/2019) and Tonsillectomy (N/A, 2001).     Social History  She reports that she quit smoking about 8 years ago. Her smoking use included cigarettes. She has a 2.5 pack-year smoking history. She has been exposed to tobacco smoke. She has never used smokeless tobacco. She reports current alcohol use. She reports that she does not use drugs.    Family History  Family History   Problem Relation Name Age of Onset   . Depression Father     .  Cancer Maternal Grandmother          Advance Care Plan  No emergency contact information on file.  No Order       Allergies  Amoxicillin and Augmentin [amoxicillin-pot clavulanate]     Medications    Current Outpatient Medications:   .  cholecalciferol (Vitamin D-3) 50 MCG (2000 UT) tablet, Take 1 tablet (50 mcg) by mouth once daily., Disp: 90 tablet, Rfl: 3  .  copper (Paragard) 380 square mm IUD, 1 each by intrauterine route 1 (one) time., Disp: , Rfl:   .  albuterol (Ventolin HFA) 90 mcg/actuation inhaler, Inhale 2 puffs every 6 (six) hours if needed for wheezing., Disp: 18 g, Rfl: 1  .  inhalational spacing device inhaler, Used as directed., Disp: 1 each, Rfl: 0    Medications      Start Medication Dose/Rate, Route, Frequency Ordered Stop    08/10/22 0000 inhalational spacing device inhaler         --, --, -- 08/10/22 4656 --    08/10/22 0000 albuterol (Ventolin HFA) 90 mcg/actuation inhaler         2 puff, inhl, Every 6 hours PRN 08/10/22 0837 --  ROS    See HPI for any additional ROS  Constitution:  No fever, no chills, no fatigue, no unexplained weight loss, no unexplained weght gain.  HEENT:  No change in vision, no change in hearing, no change in voice, no dysphagia.  Resp:  No SOB, + coughing.  Cardiac: No chest pain, no palpitation.  Abd:  No stomach pain, no nausea, no vomiting, no acid reflux, no heart burn, no diarrhea, no constipation.  GU:  No dysuria, no hematuria, no urinary incontinence.  Neuro:  No LOC, no seizure, + past migraine headache, no numbness, no dizziness.  Psy:  No anxiety, no depression, no SI/HI, no trouble sleeping.  M/S:  No muscle pain, no joint pain, no back pain.  Skin:  No worrisome moles, no rash.  Endo:  No cold intolerance, no heat intolerance, no polyuria nor polydipsia.  Heme/Lym:  No abnormal bleeding, no easy bruising, no lymphadenopathy.  Others: None.    Objective   Physical Exam    GENERAL:    AXO x 3, NAD.  HEAD:    NC/AT.  HEENT:   PERRLA, EOMI, oral  pharynx normal.  NECK:   Supple, no thyromegaly.  RESP:   CTA B/L, good air movement, no wheezes, no crackles, no rales, no ronchi.  HEART:   S1S2, RRR, No murmur, gallop or rub.  ABDOMEN:   ND/NT, no abdominal pain, no guarding or rebound, no HSM.  NEURO:   CN 2-12 grossly normal, DTR symmetrical throughout.  M/S:   Normal gait, FROM X 4.  LYMPHATIC:   No periauricular, cervical, clavicular, axillary or inguinal lymphadenopathy.  LOWER EXTREMITY:   No cyanosis, no ecchymosis, no edema.  PSYCHIATRIC:   Normal affect, normal mood, no SI/HI.  SKIN:   Warm and dry. No suspcious lesion noted.  GU:   Per gyn.  BREAST:   Per gyn.    Last Recorded Vitals  Blood pressure 108/60, pulse 72, resp. rate 18, height 1.626 m, weight 53.8 kg, SpO2 100%.     Assessment/Plan   Issues Addressed       Codes    Annual physical exam    -  Primary Z00.00    Relevant Orders    TSH with reflex    Lipid panel    Comprehensive metabolic panel    CBC and differential    RPR    Chronic cough     R05.3    Couple of months, intermittent, mostly at night, last for about 2 minutes, no SOB or chest pain, no h/o asthma.      Relevant Medications    inhalational spacing device inhaler    albuterol (Ventolin HFA) 90 mcg/actuation inhaler    Other Relevant Orders    XR CHEST 2 VIEWS    Encounter for immunization     Z23    Relevant Orders    Flu vaccine (IIV4) greater than or equal to 6 months old, preservative free (Completed)    HIV Ab/Ag screen    Hepatitis C antibody    Hepatitis B surface antigen    GC/CT (Amp DNA)    HSV 1/2 IgG, Type Specific Ab    Post-nasal drip     R09.82    Advsied Grossan sinus irrigator, and Flonase Sensimist.          See me if the coughing is not resolved after 1 month. Pt agreed.     Patient Health Questionnaire-2 Score: 0 Interpretation: Negative screening.  Follow-up & Interventions: Maintain annual screening - No additional Follow-up required     Healthy life style discussed. Avoid white food - eat more brown  food. Do 150 min of moderately intense exercise or 45 min of aerobic exercise per week. Eat 5 servings of fruits or vegetable per day. Limit caffeine to 21 oz per day. Drink about 40-50 oz of low carb no caffeine beverages per day. Advised to sleep 6-8 hours a day. Advised Vit D3 2000 units a day in Spring, Fall and Winter.     Adeel Guiffre, DO    Follow up in about 1 year (around 08/11/2023) for PE.

## 2022-08-14 ENCOUNTER — Inpatient Hospital Stay: Admit: 2022-08-14 | Primary: Family Medicine

## 2022-08-14 DIAGNOSIS — R053 Chronic cough: Secondary | ICD-10-CM

## 2022-08-16 LAB — HSV 1/2 IGG,TYPE SPECIFIC AB
HSV 1 IgG Type Spec: 35.4 {index} — ABNORMAL HIGH (ref 0.00–0.90)
HSV 2 IgG, Type Spec: <0.91 {index} (ref 0.00–0.90)

## 2022-08-16 LAB — COMPREHENSIVE METABOLIC PANEL
A/G Ratio: 1.8 (ref 1.2–2.2)
ALT: 12 IU/L (ref 0–32)
AST: 19 IU/L (ref 0–40)
Albumin: 4.9 g/dL (ref 4.0–5.0)
Alk Phosphatase: 50 IU/L (ref 44–121)
Anion Gap: 14 mmol/L (ref 10.0–18.0)
BUN/Creat Ratio: 22 (ref 9–23)
BUN: 14 mg/dL (ref 6–20)
Bili Total: 0.8 mg/dL (ref 0.0–1.2)
Calcium: 9.6 mg/dL (ref 8.7–10.2)
Carbon Dioxide: 21 mmol/L (ref 20–29)
Chloride: 104 mmol/L (ref 96–106)
Creat: 0.65 mg/dL (ref 0.57–1.00)
Globulin Total: 2.8 g/dL (ref 1.5–4.5)
Glucose: 87 mg/dL (ref 70–99)
Potassium: 4.5 mmol/L (ref 3.5–5.2)
Protein Total: 7.7 g/dL (ref 6.0–8.5)
Sodium: 139 mmol/L (ref 134–144)
eGFR: 122 mL/min/{1.73_m2} (ref >59)

## 2022-08-16 LAB — TSH WITH REFLEX: TSH: 2.21 u[IU]/mL (ref 0.450–4.500)

## 2022-08-16 LAB — CBC W/DIFF
Baso Abs: 0 10*3/uL (ref 0.0–0.2)
Basos: 0 %
Eos Abs: 0.2 10*3/uL (ref 0.0–0.4)
Eos: 4 %
Hct: 38.4 % (ref 34.0–46.6)
Hgb: 12.8 g/dL (ref 11.1–15.9)
Immature Grans Abs: 0 10*3/uL (ref 0.0–0.1)
Immature Granulocytes: 0 %
Lymphs Abs: 2.7 10*3/uL (ref 0.7–3.1)
Lymphs: 47 %
MCH: 31.3 pg (ref 26.6–33.0)
MCHC: 33.3 g/dL (ref 31.5–35.7)
MCV: 94 fL (ref 79–97)
Monocytes: 7 %
MonocytesAbs: 0.4 10*3/uL (ref 0.1–0.9)
Neutrophils Abs: 2.3 10*3/uL (ref 1.4–7.0)
Neutrophils: 42 %
Platelets: 272 10*3/uL (ref 150–450)
RBC: 4.09 x10E6/uL (ref 3.77–5.28)
RDW: 11.5 % — ABNORMAL LOW (ref 11.7–15.4)
WBC: 5.6 10*3/uL (ref 3.4–10.8)

## 2022-08-16 LAB — LIPID PANEL
Cholesterol: 157 mg/dL (ref 100–199)
HDL Cholesterol: 80 mg/dL (ref >39)
LDLc Calc (NIH): 67 mg/dL (ref 0–99)
Non-HDL Chol: 77 mg/dL (ref 0–129)
Triglycerides: 46 mg/dL (ref 0–149)
VLDLc Calc: 10 mg/dL (ref 5–40)

## 2022-08-16 LAB — HIV AB/AG SCREEN: HIV Scr 4th Gen: NONREACTIVE

## 2022-08-16 LAB — HEPATITIS C ANTIBODY: Hep C Virus Ab: NONREACTIVE

## 2022-08-16 LAB — RPR: RPR: NONREACTIVE

## 2022-08-16 LAB — HEPATITIS B SURFACE ANTIGEN: Hep B Surf Ag Scr: NEGATIVE

## 2022-08-18 LAB — GC/CT (AMP DNA)
C trach NAA: NEGATIVE
N gonorrhoeae NAA: NEGATIVE

## 2022-08-27 NOTE — Telephone Encounter (Signed)
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 All others were normal.      Thanks.     ABNORMAL TESTING: HSV1

## 2022-08-27 NOTE — Telephone Encounter (Signed)
Patient made aware via portal

## 2022-09-20 NOTE — Progress Notes (Signed)
Pt due for repeat pap- please send pap reminder letter, thanks!

## 2022-12-21 ENCOUNTER — Ambulatory Visit: Admit: 2022-12-21 | Discharge: 2022-12-21 | Attending: Women's Health | Primary: Family Medicine

## 2022-12-21 DIAGNOSIS — R102 Pelvic and perineal pain: Secondary | ICD-10-CM

## 2022-12-21 NOTE — H&P (Signed)
[facility]  [facility] [place]  [address]  [address]  [place] [address]  Dept: [phone]  Dept Fax: [phone]     Patient ID: Emma Hartman is a 32 y.o. G1P1001 who presents for Annual GYN Exam    Subjective: Pt presents for routine Gyn care. Menses regular, not too heavy, sometimes crampy. Has Paraguard since 04/2021. Happy with this method. 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 Reports pelvic pain with intercourse. More painful in certain positions. Denies any bleeding after IC.     LMP: 12/07/22    Last Pap:   10/09/21 ASCUS/HPV Neg  07/2020  ASCUS/HPV Neg, Had Colpo 10/2020 NILM  2020 NILM  2019 LGSIL     PMH: no changes     PSH: no changes      OB Hx:   2021 P C/S @ [facility] girl        Pt sees PCP regularly.      Married. Feels safe at home      Denies smoking, ETOH. No drug use     No FH colon, uterine, or ovarian CA. Mat aunt Breast Ca dx age 61     Last Mammogram N/A  Last Colonoscopy     Problems:   Patient Active Problem List   Diagnosis    ASCUS of cervix with negative high risk HPV    History of abnormal cervical Pap smear    Anemia    Migraine     Current Medications:   Current Outpatient Medications   Medication Instructions    albuterol (Ventolin HFA) 90 mcg/actuation inhaler 2 puffs, inhalation, Every 6 hours PRN    cholecalciferol (VITAMIN D-3) 50 mcg, oral, Daily    copper (Paragard) 380 square mm IUD 1 each, intrauterine, Once    inhalational spacing device inhaler Used as directed.     Allergies:   Allergies   Allergen Reactions    Amoxicillin Rash    Augmentin [Amoxicillin-Pot Clavulanate] Rash     Other reaction(s): Itching skin     Past Medical History:   Past Medical History:   Diagnosis Date    Breech presentation ([facility])     History of abnormal cervical Pap smear 10/22/2020     Past Surgical History:   Past Surgical History:   Procedure Laterality Date    CESAREAN  SECTION, LOW TRANSVERSE  10/24/2019    TONSILLECTOMY N/A 2001     Family History:   Family History   Problem Relation Name Age of Onset    Depression Father      Cancer Maternal Grandmother       Social History:   Social History     Tobacco Use    Smoking status: Former     Packs/day: 0.50     Years: 5.00     Additional pack years: 0.00     Total pack years: 2.50     Types: Cigarettes     Quit date: 2016     Years since quitting: 8.4     Passive exposure: Past    Smokeless tobacco: Never   Vaping Use    Vaping Use: Never used   Substance Use Topics    Alcohol use: Yes     Comment: occasional    Drug use: Never       Physical Exam:  BP 110/68   Wt 54.6 kg   LMP  12/01/2022 (Approximate)   BMI 20.67 kg/m   Edwin Dada ([place]) present for exam   Appearance: Normal appearance.   HENT: Normocephalic and atraumatic.   Lungs: Normal respiratory effort  Breasts: Breasts are symmetrical. No masses, tenderness or nipple discharge. No axillary lymphadenopathy.  Abdomen: Soft, nontender/nondistended. No masses on palpation.  Genitourinary:     General: Normal vulva.      Vagina: Normal, no abnl discharge.      Cervix: No cervical motion tenderness. Pap/GP collected      Uterus: Normal. Not enlarged and not tender.       Adnexae: Nontender, no masses, no fullness  Musculoskeletal:    Normal range of motion.   Skin: Warm and dry.   Neurological: No gross neurological deficits  Psychiatric:    Normal affect, behavior and judgment    Dr Berna Bue available for any questions or concerns.       Assessment/Plan:  32 y.o. G1P1001 for Routine Annual Gyn Exam. Denies any menstrual complaints at this time.  Reports pelvic pain with intercourse. More painful in certain positions. Denies any bleeding after IC. Pelvic U/S ordered. If u/s NL, can try pelvic floor PT. Handout on Pelvic floor PT resources given to pt.   Hx of intermittent pos HPV and abnl paps for approx 10+yrs in [place]. Had Colpo 10/2020 NILM.   No significant changes in  PMH, PSH, Rx, All, FH or SH.  No FH colon, uterine, or ovarian CA. Mat aunt Breast Ca dx age 74  Routine preventative measures reviewed including monthly SBE,  diet, weight-bearing exercise, calcium supplementation.    Discussed Mammograms annually starting at age 30, colonoscopies age 30 or per PCP.  Denies DV or substance use issues.    Pap smear performed with HPV testing. GP collected. Reviewed current guidelines.  Contraception discussed. Has Paraguard since 04/2021. Happy with this method. No issues. Due for removal 04/2031. Will call if any questions or concerns.   F/U annually  Letter to PCP    Marye Round, NP

## 2022-12-22 ENCOUNTER — Inpatient Hospital Stay: Admit: 2022-12-22 | Discharge: 2022-12-22 | Primary: Family Medicine

## 2022-12-22 DIAGNOSIS — R102 Pelvic and perineal pain: Secondary | ICD-10-CM

## 2022-12-22 LAB — GC/CT (AMP DNA)
C trach NAA: NEGATIVE
N gonorrhoeae NAA: NEGATIVE

## 2022-12-25 LAB — THINPREP TIS PAP AND HR HPV DNA REFLEX GENOTYPES 16,18
Cyto Comments: 0
HPV, cobas high-risk: POSITIVE — AB

## 2022-12-25 LAB — HPV, COBAS GENOTYPES 16/18 (REFLEX ONLY)
HPV 16: NEGATIVE
HPV 18: NEGATIVE

## 2022-12-27 NOTE — Telephone Encounter (Signed)
Spoke to pt regarding the results of her pelvic ultrasound. Paraguard is low in endometrial cavity and extends into endocervical canal. Will need to be replaced and new one reinserted under u/s guidance per [place]. Pt aware. Will abstain from IC until removal/reinsertion. Requested front desk call to make appt for pt. They will call to schedule appt for her. SO

## 2022-12-27 NOTE — Telephone Encounter (Signed)
Hi [name]. Can you please send this pt a letter to repeat pap in 1 year for + HPV? Thank you, Sue

## 2022-12-27 NOTE — Telephone Encounter (Signed)
Pt aware of pap results. NILM. HPV +. Will need pap in 1 year. Reminder letter sent. Added to high risk pap pool. SO

## 2022-12-27 NOTE — Progress Notes (Signed)
Reminder set for pap in 1yr

## 2022-12-28 NOTE — Telephone Encounter (Signed)
Pt had requested a call back to discuss IUD options. She is scheduled for Paraguard removal and reinsertion under ultrasound guidance due to malpositioning of her current Paraguard IUD. I spoke to the pt. She would like the [place] IUD inserted instead of another Paraguard IUD. We disc that with progestin containing IUDs, usually have irreg bleeding and spotting initially ~ 6w-53mths then often light MP or even amenorrhea. Will plan to abstain or use condoms until IUD insertion. Will also plan for Motrin 600mg  po prior to appt. All questions were answered. SO

## 2022-12-31 ENCOUNTER — Ambulatory Visit: Primary: Family Medicine

## 2022-12-31 ENCOUNTER — Encounter: Attending: Obstetrics & Gynecology | Primary: Family Medicine

## 2023-01-03 ENCOUNTER — Encounter: Attending: Obstetrics & Gynecology | Primary: Family Medicine

## 2023-01-03 ENCOUNTER — Encounter: Primary: Family Medicine

## 2023-01-06 ENCOUNTER — Ambulatory Visit: Admit: 2023-01-06 | Discharge: 2023-01-06 | Attending: Specialist | Primary: Family Medicine

## 2023-01-06 ENCOUNTER — Inpatient Hospital Stay: Admit: 2023-01-06 | Discharge: 2023-01-06 | Primary: Family Medicine

## 2023-01-06 DIAGNOSIS — R102 Pelvic and perineal pain: Secondary | ICD-10-CM

## 2023-01-06 DIAGNOSIS — T8332XD Displacement of intrauterine contraceptive device, subsequent encounter: Secondary | ICD-10-CM

## 2023-01-06 MED ORDER — norgestimate-ethinyl estradioL (Ortho-Cyclen) 0.25-35 mg-mcg tablet
0.25-35 | ORAL_TABLET | Freq: Every day | ORAL | 1 refills | Status: DC
Start: 2023-01-06 — End: 2023-03-24

## 2023-01-06 NOTE — Progress Notes (Signed)
Problem visit  32 year old G1, P62 woman with a new onset of premenstrual cramping lasting for days 2-week prior over the last 3 to 4 months.  She does have a prior history of dysmenorrhea and had been placed on OCPs when she was younger.  She went back on a pill right after her baby was born and had problems with moodiness/depression which may be more postpartum because she did tolerate the pills in the past.  Her ultrasound today which I did watch looks like there is a bent right arm of the IUD that is in the endometrial cavity but the IUD itself is in the same in the correct place and there is no embedment into the myometrium.  I discussed her options with her.  I made it clear that it is unlikely the IUDs malposition is the cause of her premenstrual pains, as it would usually cause pain all the time.  It is even less likely given that it is not embedded in the wall.  I suspect she may have endometriosis which would based on her history and the timing of her pain.  In addition to that the onset of the pain being about a year and a half after she stopped OCPs would also be consistent with that as well.  Because of that, I do not feel that removing the IUD is going to improve that pain though replacing it with a [place] or Mirena IUD might help.  We discussed her options including removing the IUD and not replacing it, removing it and replacing with a Kyleena or Mirena, starting OCPs plus or minus removal of the IUD, Orilissa/Lupron, surgery.  Since she has done well with OCPs in the past other than postpartum, I recommended we start her on OCPs today and leave the ParaGard IUD in place at this time.  She will start them this Sunday and follow-up in 3 to 4 months.  If her pains persist or become more daily, then we could consider removing the IUD at any time.  If her pain resolves with the pill, it makes endometriosis more likely and I would recommend she continue on the pill plus minus the IUD.  We discussed her  differential diagnosis and various treatment options and see detail today and in the end she is agreeable to starting the OCPs now and keeping the IUD in place and to see how she does.  She will schedule appointment in 3 to 4 months.  I sent a prescription for Ortho-Cyclen x 6 months.  All of her questions were answered.  Side effects were reviewed.  Time was 20 minutes, all discussion time and review of her ultrasound.  Copy to PCP.

## 2023-03-24 MED ORDER — norgestimate-ethinyl estradioL (Ortho-Cyclen) 0.25-35 mg-mcg tablet
0.25-35 | ORAL_TABLET | Freq: Every day | ORAL | 1 refills | Status: DC
Start: 2023-03-24 — End: 2023-04-18

## 2023-03-24 NOTE — Telephone Encounter (Signed)
90 supply 1 refill sent to pharmacy.

## 2023-04-14 ENCOUNTER — Encounter: Attending: Specialist | Primary: Family Medicine

## 2023-04-18 ENCOUNTER — Ambulatory Visit: Admit: 2023-04-18 | Discharge: 2023-04-18 | Attending: Specialist | Primary: Family Medicine

## 2023-04-18 DIAGNOSIS — R102 Pelvic and perineal pain: Secondary | ICD-10-CM

## 2023-04-18 MED ORDER — norgestimate-ethinyl estradioL (Ortho-Cyclen) 0.25-35 mg-mcg tablet
0.25-35 | ORAL_TABLET | Freq: Every day | ORAL | 4 refills | Status: DC
Start: 2023-04-18 — End: 2023-06-22

## 2023-04-18 NOTE — Progress Notes (Signed)
Problem follow-up  32 year old G1, P50 woman with history of dysmenorrhea that having any progressive worse.  3 months ago started on OCPs and she is here today for follow-up.  She states that her pain is significantly better and she is very happy with the pill.  She denies any side effects on the pill.  She still does not have regular periods on it in fact 1 month bled for about 3 weeks that was just light spotting.  She denies missing any pills.  We discussed her options and I recommended she continue it for now.  I do expect appears to become progressively more predictable over time.  If 3 months from now it is the periods are not more predictable and/or lasting for more than 1 week, she will call me and we will reevaluate.  Otherwise I refilled her pill for 1 year and recommend she do stay on it unless she decides that she wants to get pregnant.  Long-term option for her would include switching from the ParaGard to the Mirena IUD, but for now we will just leave the ParaGard in place.  Blood pressure is normal.  All her questions were answered.  Follow-up in 1 year for an annual exam or sooner if any problems.  Time 50 minutes, all discussion of chart.  Time.  Copy to PCP.

## 2023-06-22 MED ORDER — norgestimate-ethinyl estradioL (Ortho-Cyclen) 0.25-35 mg-mcg tablet
0.25-35 | ORAL_TABLET | Freq: Every day | ORAL | 0 refills | Status: DC
Start: 2023-06-22 — End: 2023-07-25

## 2023-06-22 NOTE — Telephone Encounter (Signed)
F/U appt scheduled for 07/21/23.   Requesting refill until then.   Refill sent

## 2023-07-04 ENCOUNTER — Ambulatory Visit
Admit: 2023-07-04 | Discharge: 2023-07-04 | Payer: PRIVATE HEALTH INSURANCE | Attending: Specialist | Primary: Family Medicine

## 2023-07-04 DIAGNOSIS — N939 Abnormal uterine and vaginal bleeding, unspecified: Secondary | ICD-10-CM

## 2023-07-04 NOTE — Progress Notes (Signed)
Problem visit  32 year old with history of pelvic pain dysmenorrhea menorrhagia.  She had responded well to OCPs while having a ParaGard in place.  She has been on the pill now for 6 months and come back today complain that her periods are getting heavier and longer not lighter.  The pain is still better.  She is concerned with the bleeding wants to do something different so she is not bleeding is much.  We discussed the differential diagnosis and options.  I recommend we get an ultrasound first in order to make sure the IUD is still in the right place and also make sure there is no polyps or other abnormalities that might be causing the bleeding.  Assuming the ultrasound is normal, then I recommend she come in and exchange the ParaGard for her Mirena IUD in that way she can stop the both the pill and the ParaGard.  She is agreeable to this.  Will schedule that sometime in the next couple weeks.  She will continue the pill until we can put that in.  All of her questions were answered.  Time this visit was 10 minutes, all discussion time.  Copy to PCP.

## 2023-07-12 ENCOUNTER — Inpatient Hospital Stay: Admit: 2023-07-12 | Discharge: 2023-07-12 | Payer: PRIVATE HEALTH INSURANCE | Primary: Family Medicine

## 2023-07-12 DIAGNOSIS — N939 Abnormal uterine and vaginal bleeding, unspecified: Secondary | ICD-10-CM

## 2023-07-12 NOTE — Telephone Encounter (Signed)
07/12/23  [facility] letter sent.

## 2023-07-18 NOTE — Telephone Encounter (Signed)
Telephone call  I spoke with her today about her ultrasound.  It shows her ParaGard IUD is in the wrong position and embedded in the wall which is probably causing a lot of her bleeding and pain.  I did recommend to get removed.  I told her she has the option of just staying on the pills or getting another IUD put in in which case I recommend the Mirena.  She is scheduled in a week come back to have this 1 removed and she wants a Mirena placed.

## 2023-07-21 ENCOUNTER — Encounter: Payer: PRIVATE HEALTH INSURANCE | Attending: Specialist | Primary: Family Medicine

## 2023-07-25 ENCOUNTER — Ambulatory Visit
Admit: 2023-07-25 | Discharge: 2023-07-25 | Payer: PRIVATE HEALTH INSURANCE | Attending: Specialist | Primary: Family Medicine

## 2023-07-25 DIAGNOSIS — T8332XA Displacement of intrauterine contraceptive device, initial encounter: Secondary | ICD-10-CM

## 2023-07-25 MED ORDER — norgestimate-ethinyl estradioL (Ortho-Cyclen) 0.25-35 mg-mcg tablet
0.25-35 | ORAL_TABLET | Freq: Every day | ORAL | 4 refills | Status: AC
Start: 2023-07-25 — End: ?

## 2023-07-25 NOTE — Progress Notes (Signed)
Patient ID: Emma Hartman is a 33 y.o. female.    IUD Management    Performed by: Ronney Lion III, MD  Authorized by: Ronney Lion III, MD    Procedure: IUD removal    Consent obtained by patient, parent, or legal power of attorney - including discussion of procedure risks and benefits, patient questions answered, and patient education provided: yes    Reason for removal: malposition    IUD removed: yes    Removed without complications: yes    IUD intact: yes    Patient with a malpositioned    IUD and subsequent increasing and abnormal uterine bleeding and pelvic pain.  She is currently on OCPs for control of the symptoms and here today to have the IUD removed.  She is happy on the pill and states is actually helping her acne as well so she would like to stay on the pill as opposed to replacing the IUD.  All her questions were answered.  I removed the IUD uneventfully.  I refilled her prescription for the birth control pill for a year.  If she is doing well on the pill, she can follow-up annually or as needed.  If she has persistent problems of bleeding and/or pain despite the pill, she will follow-up sooner for placement of a Mirena IUD.  All questions answered.  Copy to PCP.

## 2023-08-10 IMAGING — MR Joelho Esquerdo
6 series · 48 of 48 positions shown · non-contrast
Comparison: none

[Series 3001: sagital_dp_fs · sagittal · 4.0mm · 0.59mm/px · 8 of 20 slices shown]
[im 1/20]
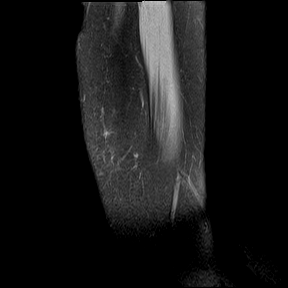
[im 3/20]
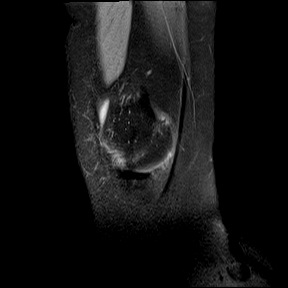
[im 6/20]
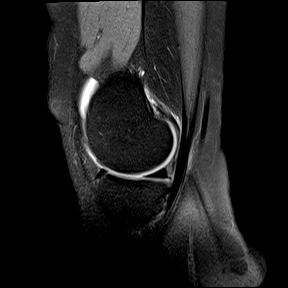
[im 9/20]
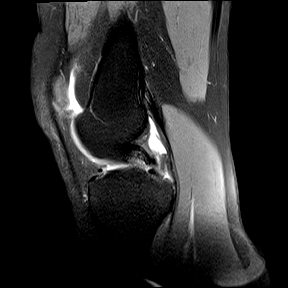
[im 11/20]
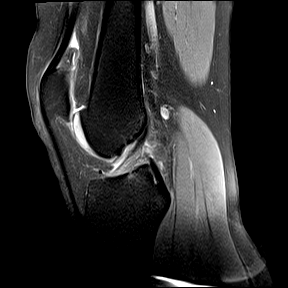
[im 14/20]
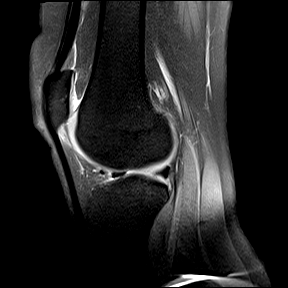
[im 17/20]
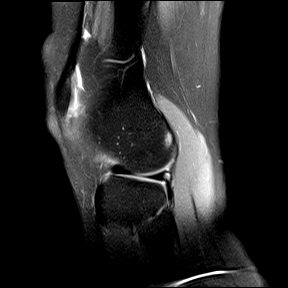
[im 20/20]
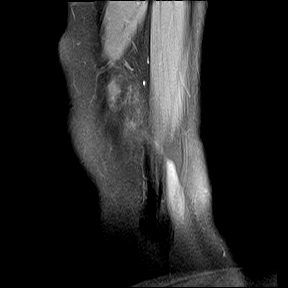

[Series 4001: sagital_dp · sagittal · 4.0mm · 0.59mm/px · 8 of 20 slices shown]
[im 1/20]
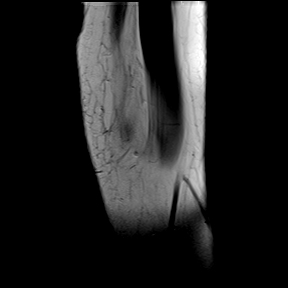
[im 3/20]
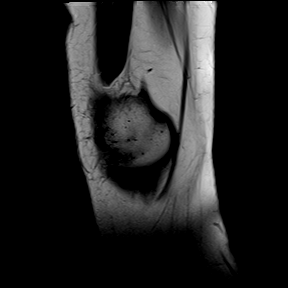
[im 6/20]
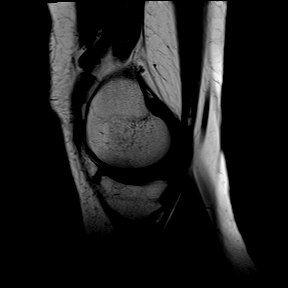
[im 9/20]
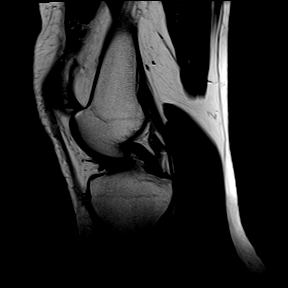
[im 11/20]
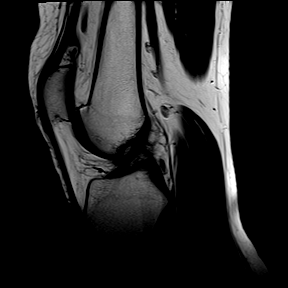
[im 14/20]
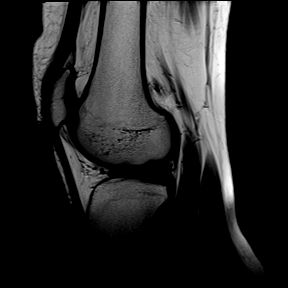
[im 17/20]
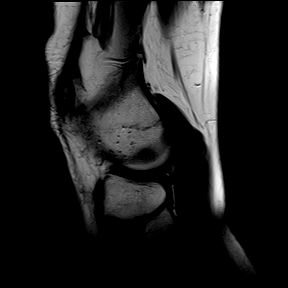
[im 20/20]
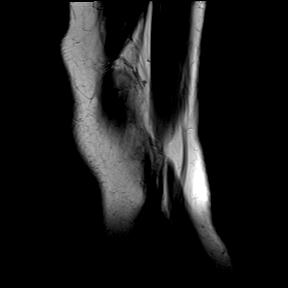

[Series 5001: coronal_dp_fs · coronal · 4.0mm · 0.62mm/px · 9 of 20 slices shown]
[im 1/20]
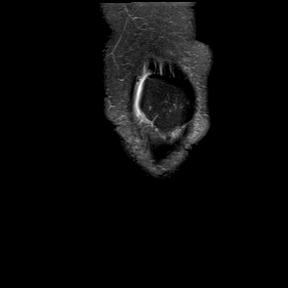
[im 3/20]
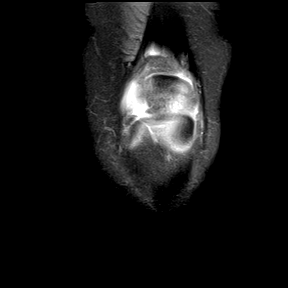
[im 5/20]
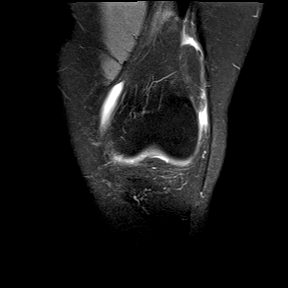
[im 8/20]
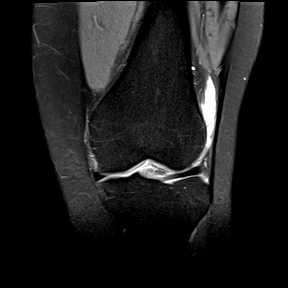
[im 10/20]
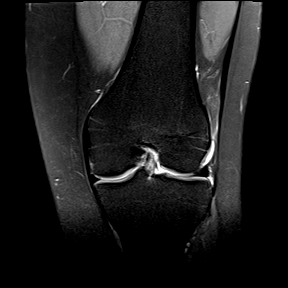
[im 12/20]
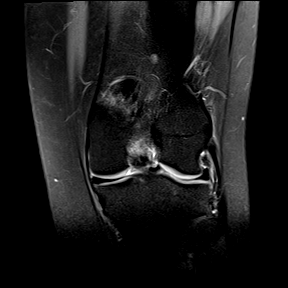
[im 15/20]
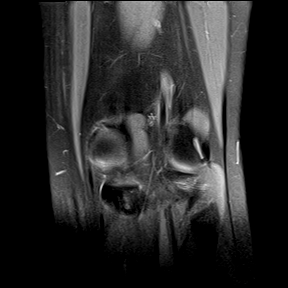
[im 17/20]
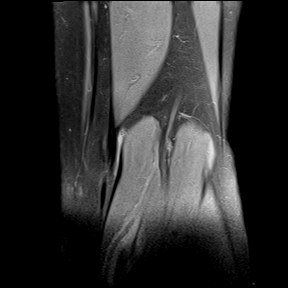
[im 20/20]
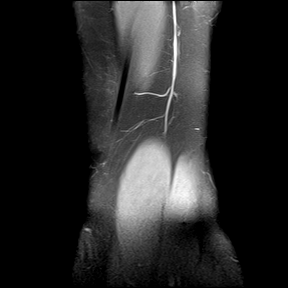

[Series 6001: coronal_t1 · coronal · 4.0mm · 0.56mm/px · 9 of 20 slices shown]
[im 1/20]
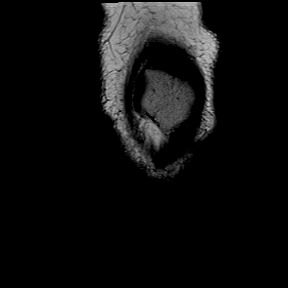
[im 3/20]
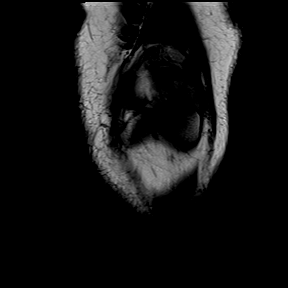
[im 5/20]
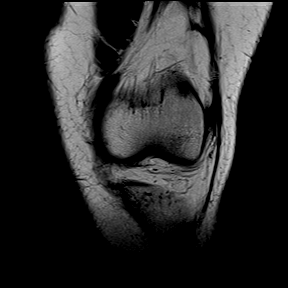
[im 8/20]
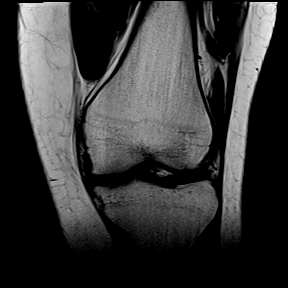
[im 10/20]
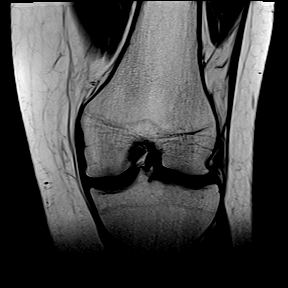
[im 12/20]
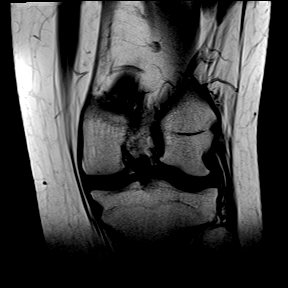
[im 15/20]
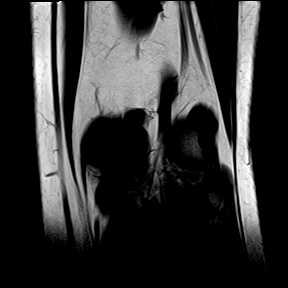
[im 17/20]
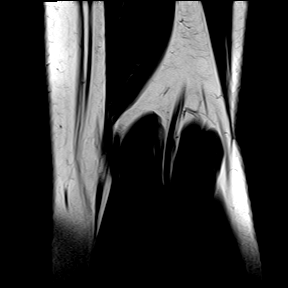
[im 20/20]
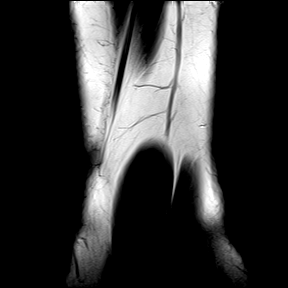

[Series 7001: axial_dp_fs · axial · 4.0mm · 0.53mm/px · z∈[-92,+14]mm · 10 of 23 slices shown]
[im 1/23]
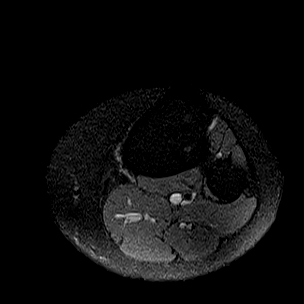
[im 3/23]
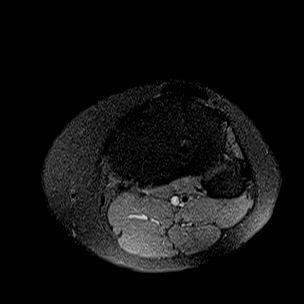
[im 5/23]
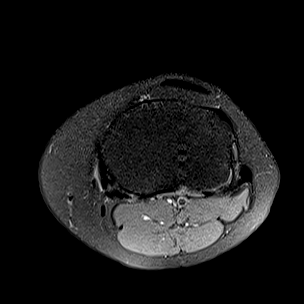
[im 8/23]
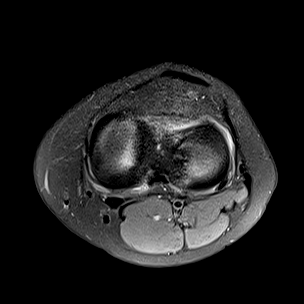
[im 10/23]
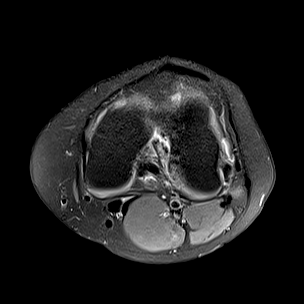
[im 13/23]
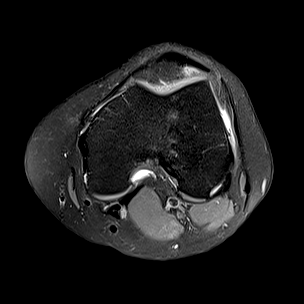
[im 15/23]
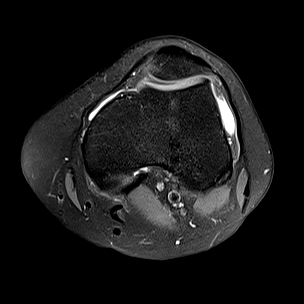
[im 18/23]
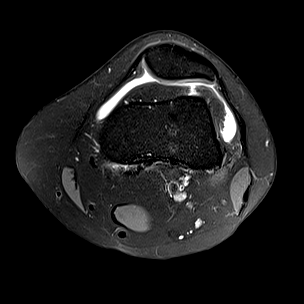
[im 20/23]
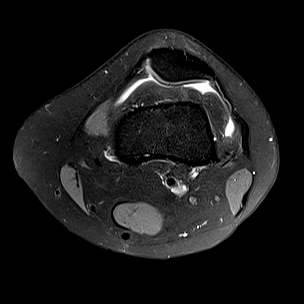
[im 23/23]
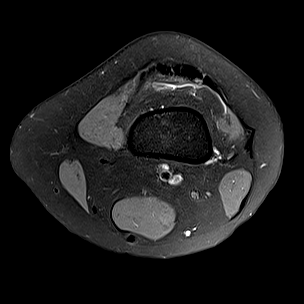

[Series 8001: obl_dp_lca · oblique · left · 2.0mm · 0.44mm/px · 4 of 9 slices shown]
[im 1/9]
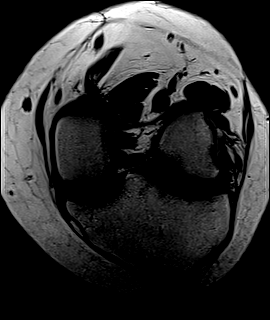
[im 3/9]
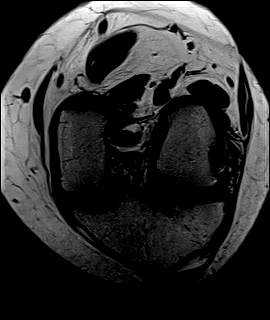
[im 6/9]
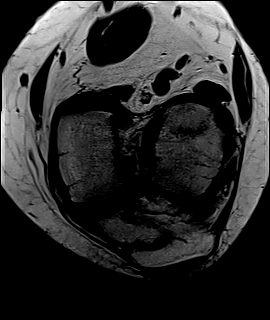
[im 9/9]
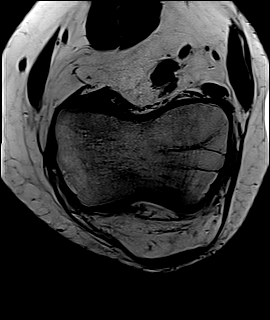

[48 of 48 positions shown; findings below may reference images not displayed]

RESSONÂNCIA MAGNÉTICA DO JOELHO ESQUERDO
Técnica:
Exame  realizado  pela  técnica  de  fast  spin  echo  com  imagens  obtidas  predominantemente  em  T1  e  T2,  em
aquisições multiplanares.
INFORMAÇÃO ADICIONAL: -
Análise:
Discreta degeneração mucinosa do corno posterior do menisco medial, sem roturas.
Menisco lateral com forma, contornos e sinal preservados, sem sinais de lesão.
Ligamentos cruzados e colaterais com continuidade, espessura e sinal conservados.
Entesopatia da origem do gastrocnêmio medial.
Patela alta com o joelho em extensão.
Edema da porção superolateral da gordura de Hoffa.
Mínimo derrame articular.
Pequeno cisto na bursa do gastrocnêmio medial / semimembranoso.
Condropatia  patelar  caracterizada  por  fissuras  condrais  superficiais  no  vértice  e  início  das  facetas,  sem
alteração de sinal do osso subcondral.
Sulco da tróclea femoral raso.
Demais superfícies condrais regulares, sem fissuras ou erosões evidentes.
Tendões  quadricipital,  patelar,  bíceps  femoral  distal,  trato  iliotibial  e  tendões  da  pata  de  ganso  sem
particularidades.
REVOLUX IMAGENS LTDA
Conclusão:
Discreta degeneração mucinosa do corno posterior do menisco medial, sem roturas.
Entesopatia da origem do gastrocnêmio medial.
Patela alta com o joelho em extensão.
Hoffite lateral.
INFORMAÇÃO ADICIONAL: -
Mínimo derrame articular.
Pequeno cisto de Baker.
Condropatia patelar.
Sinais de displasia da tróclea femoral.
REVOLUX IMAGENS LTDA

## 2023-08-15 ENCOUNTER — Ambulatory Visit
Admit: 2023-08-15 | Discharge: 2023-08-15 | Payer: PRIVATE HEALTH INSURANCE | Attending: Family Medicine | Primary: Family Medicine

## 2023-08-15 DIAGNOSIS — Z0000 Encounter for general adult medical examination without abnormal findings: Secondary | ICD-10-CM

## 2023-08-15 NOTE — Progress Notes (Addendum)
Outpatient  H&P  [facility]  [address]  [address] [place] [address]  [place] [address]  [phone]    Today's Date: 08/15/2023  MRN: [id number]  Name: SEKAI NAYAK  DOB: 1990-08-06    Chief Complaint  Chief Complaint   Patient presents with    Annual Exam       History Of Present Illness  Emma Hartman is a 33 y.o. female presenting for PE.      Past Medical History  She has a past medical history of Breech presentation (HHS-HCC) and History of abnormal cervical Pap smear (10/22/2020).    Surgical History  She has a past surgical history that includes Cesarean section, low transverse (10/24/2019) and Tonsillectomy (N/A, 2001).     Social History  She reports that she quit smoking about 9 years ago. Her smoking use included cigarettes. She started smoking about 14 years ago. She has a 2.5 pack-year smoking history. She has been exposed to tobacco smoke. She has never used smokeless tobacco. She reports current alcohol use. She reports that she does not use drugs.    Family History  Family History   Problem Relation Name Age of Onset    Depression Father      Cancer Maternal Grandmother      Lung cancer Paternal Grandfather      Breast cancer Other Maternal Aunt         Advance Care Plan  No emergency contact information on file.  No Order       Allergies  Amoxicillin and Augmentin [amoxicillin-pot clavulanate]     Medications    Current Outpatient Medications:     norgestimate-ethinyl estradioL (Ortho-Cyclen) 0.25-35 mg-mcg tablet, Take 1 tablet by mouth once daily., Disp: 84 tablet, Rfl: 4    Medications       None            ROS    See HPI for any additional ROS  Constitution:  No fever, no chills, no fatigue, no unexplained weight loss, no unexplained weght gain.  HEENT:  No change in vision, no change in hearing, no change in voice, no dysphagia.  Resp:  No SOB, no coughing.  Cardiac: No chest pain, no palpitation.  Abd:  No stomach pain, no nausea, no vomiting, no acid reflux, no heart burn, no diarrhea, no  constipation.  GU:  No dysuria, no hematuria, no urinary incontinence.  Neuro:  No LOC, no seizure, no headache, no numbness, no dizziness.  Psy:  +  anxiety, no depression, no SI/HI, no trouble sleeping.  M/S:  No muscle pain, no joint pain, no back pain.  Skin:  No worrisome moles, no rash.  Endo:  No cold intolerance, no heat intolerance, no polyuria nor polydipsia.  Heme/Lym:  No abnormal bleeding, no easy bruising, no lymphadenopathy.  Others: None.      Objective   Physical Exam    GENERAL:    AXO x 3, NAD.  HEAD:    NC/AT.  HEENT:   PERRLA, EOMI, oral pharynx normal.  NECK:   Supple, no thyromegaly.  RESP:   CTA B/L, good air movement, no wheezes, no crackles, no rales, no ronchi.  HEART:   S1S2, RRR, No murmur, gallop or rub.  ABDOMEN:   ND/NT, no abdominal pain, no guarding or rebound, no HSM.  NEURO:   CN 2-12 grossly normal, DTR symmetrical throughout.  M/S:   Normal gait, FROM X 4.  LYMPHATIC:   No periauricular, cervical,  clavicular, axillary or inguinal lymphadenopathy.  LOWER EXTREMITY:   No cyanosis, no ecchymosis, no edema.  PSYCHIATRIC:   Normal affect, normal mood, no SI/HI.  SKIN:   Warm and dry. No suspcious lesion noted.  GU:   Per gyn.  BREAST:   Per gyn.    Last Recorded Vitals  Blood pressure 120/78, pulse 72, resp. rate 20, height 1.575 m, weight 52.9 kg, last menstrual period 07/18/2023, SpO2 100%.     Assessment/Plan   Issues Addressed         Codes    Annual physical exam    -  Primary Z00.00    Relevant Orders    Lipid panel    TSH with reflex    Comprehensive metabolic panel    CBC and differential    Encounter for administration of vaccine     Z23    Relevant Orders    Flu vaccine greater than or equal to 6 mon old, preservative free IM (Completed)             Diagnosis Plan   1. Annual physical exam  Lipid panel    TSH with reflex    Comprehensive metabolic panel    CBC and differential    Lipid panel    TSH with reflex    Comprehensive metabolic panel    CBC and differential      2.  Encounter for administration of vaccine  Flu vaccine greater than or equal to 6 mon old, preservative free IM          Advised eye exam - importance discussed.     Healthy life style discussed. Avoid white food - eat more brown food. Do 150 min of moderately intense exercise or 45 min of aerobic exercise per week. Eat 5 servings of fruits or vegetable per day. Limit caffeine to 21 oz per day. Drink about 40-50 oz of low carb no caffeine beverages per day. Advised to sleep 6-8 hours a day. Advised Vit D3 2000 units a day in Spring, Fall and Winter.     Patient Health Questionnaire-2 Score: 0 Interpretation: Negative screening.      Follow-up & Interventions: Maintain annual screening - No additional Follow-up required    Jaivion Kingsley, DO    Follow up in about 1 year (around 08/14/2024) for PE.

## 2023-08-15 NOTE — Progress Notes (Signed)
Patient declines breast exam but ok with skin check,

## 2023-08-28 LAB — COMPREHENSIVE METABOLIC PANEL
A/G Ratio: 1.4 (calc) (ref 1.0–2.5)
ALT: 11 U/L (ref 6–29)
AST: 15 U/L (ref 10–30)
Albumin: 4.2 g/dL (ref 3.6–5.1)
Alkaline Phosphatase: 41 U/L (ref 31–125)
Bilirubin, total: 0.6 mg/dL (ref 0.2–1.2)
CALCIUM: 9.4 mg/dL (ref 8.6–10.2)
CARBON DIOXIDE: 28 mmol/L (ref 20–32)
CHLORIDE: 102 mmol/L (ref 98–110)
CREATININE: 0.63 mg/dL (ref 0.50–0.97)
EGFR: 122 mL/min/{1.73_m2} (ref >=60)
GLUCOSE: 78 mg/dL (ref 65–139)
Globulin, Total: 3 g/dL (ref 1.9–3.7)
POTASSIUM: 4.4 mmol/L (ref 3.5–5.3)
Protein, total: 7.2 g/dL (ref 6.1–8.1)
SODIUM: 138 mmol/L (ref 135–146)
UREA NITROGEN (BUN): 14 mg/dL (ref 7–25)

## 2023-08-28 LAB — LIPID PANEL
CHOL/HDLC RATIO: 2 (calc) (ref <5.0)
CHOLESTEROL, TOTAL: 192 mg/dL (ref <200)
EXT NON HDL CHOLESTEROL: 96 mg/dL (ref <130)
HDL CHOLESTEROL: 96 mg/dL (ref >=50)
LDL-CHOLESTEROL: 82 mg/dL
TRIGLYCERIDES: 59 mg/dL (ref <150)

## 2023-08-28 LAB — CBC W/DIFF
ABSOLUTE BASOPHILS: 19 {cells}/uL (ref 0–200)
ABSOLUTE EOSINOPHILS: 93 {cells}/uL (ref 15–500)
ABSOLUTE LYMPHOCYTES: 2539 {cells}/uL (ref 850–3900)
ABSOLUTE MONOCYTES: 558 {cells}/uL (ref 200–950)
ABSOLUTE NEUTROPHILS: 6092 {cells}/uL (ref 1500–7800)
BASOPHILS: 0.2 %
EOSINOPHILS: 1 %
HEMATOCRIT: 35.8 % (ref 35.0–45.0)
HEMOGLOBIN: 12 g/dL (ref 11.7–15.5)
LYMPHOCYTES: 27.3 %
MCH: 30.9 pg (ref 27.0–33.0)
MCHC: 33.5 g/dL (ref 32.0–36.0)
MCV: 92.3 fL (ref 80.0–100.0)
MONOCYTES: 6 %
MPV: 10.8 fL (ref 7.5–12.5)
NEUTROPHILS: 65.5 %
Platelets: 306 10*3/uL (ref 140–400)
RDW: 12 % (ref 11.0–15.0)
RED BLOOD CELL COUNT: 3.88 10*6/uL (ref 3.80–5.10)
WHITE BLOOD CELL COUNT: 9.3 10*3/uL (ref 3.8–10.8)

## 2023-08-28 LAB — TSH WITH REFLEX: TSH W/REFLEX TO FT4: 2.73 m[IU]/L

## 2023-11-10 IMAGING — MR [place]
4 of 5 series · 30 of 48 positions shown · non-contrast
Comparison: none

[Series 2001: cor t2_stir · coronal · 4.5mm · 0.91mm/px · 8 of 15 slices shown]
[im 1/15]
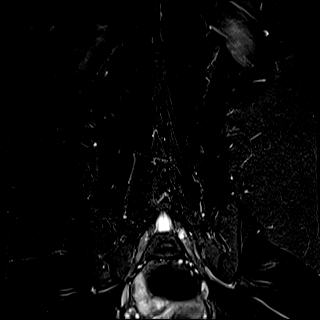
[im 2/15]
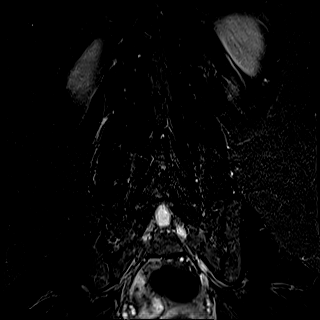
[im 5/15]
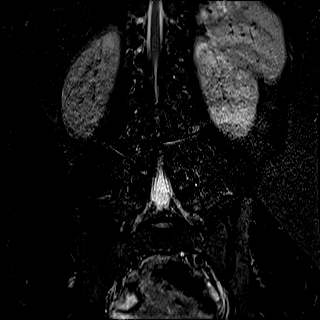
[im 7/15]
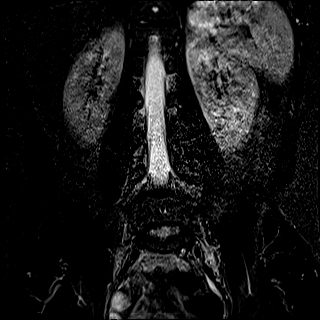
[im 8/15]
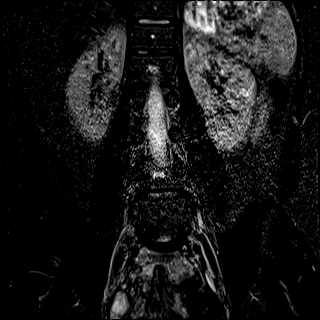
[im 10/15]
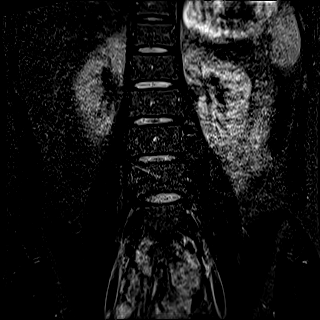
[im 13/15]
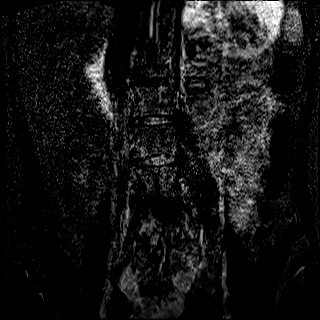
[im 15/15]
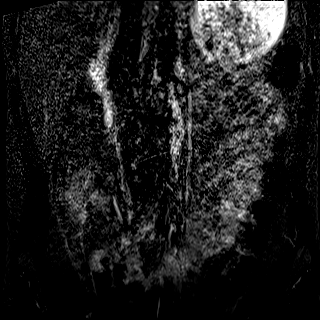

[Series 3001: sagital_t1 · sagittal · 4.5mm · 0.88mm/px · 8 of 12 slices shown]
[im 1/12]
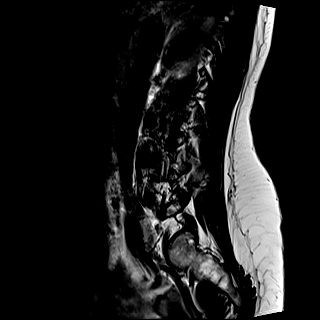
[im 2/12]
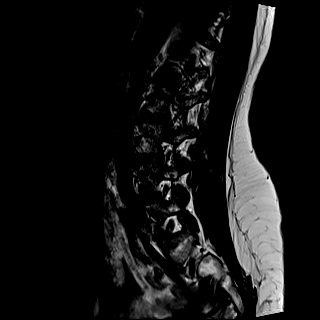
[im 4/12]
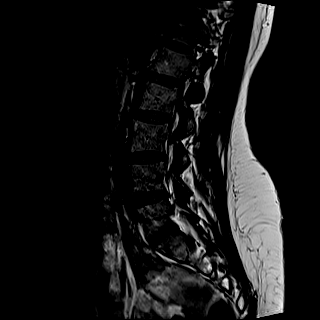
[im 5/12]
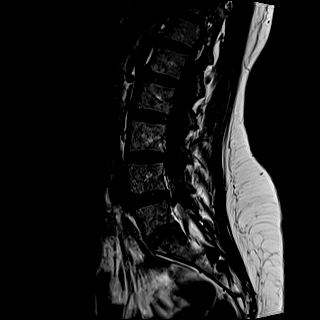
[im 7/12]
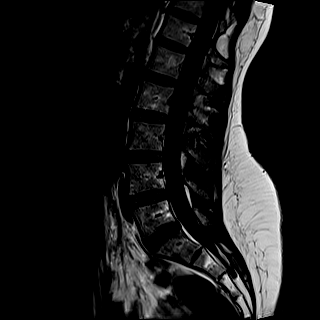
[im 8/12]
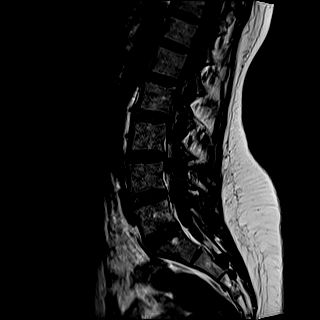
[im 10/12]
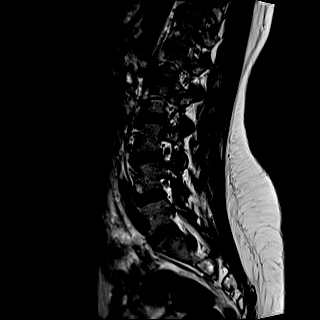
[im 12/12]
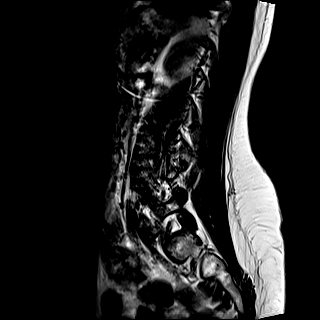

[Series 4001: sagital_t2_stir · sagittal · 4.5mm · 0.91mm/px · 8 of 12 slices shown]
[im 1/12]
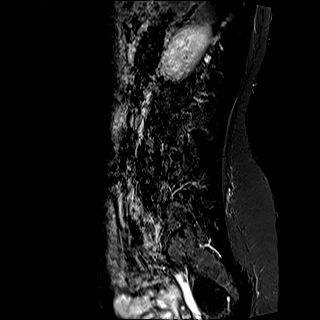
[im 2/12]
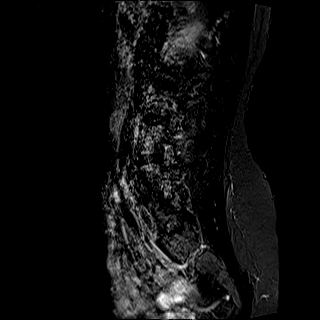
[im 4/12]
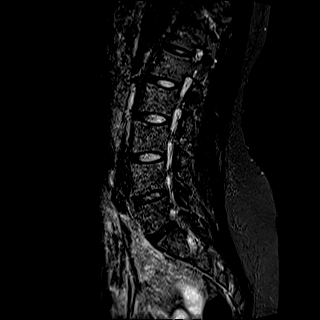
[im 5/12]
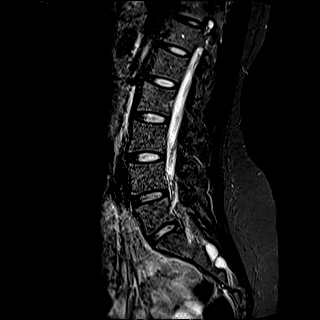
[im 7/12]
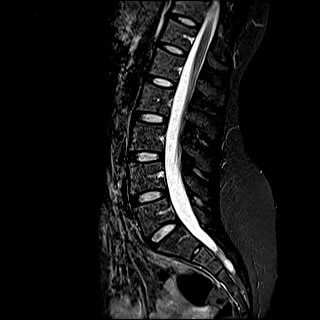
[im 8/12]
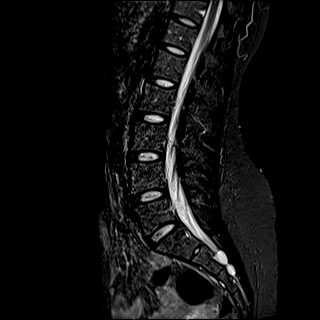
[im 10/12]
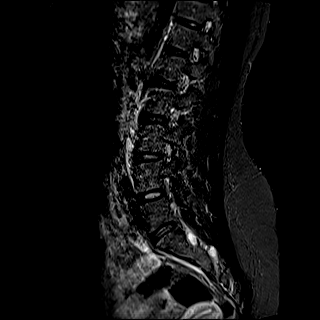
[im 12/12]
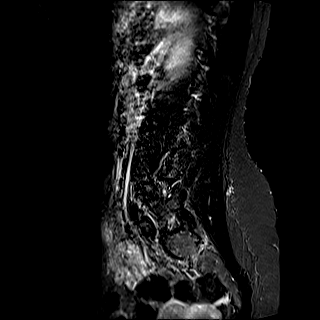

[Series 5001: sagital_t2 · sagittal · 4.5mm · 0.44mm/px · 6 of 12 slices shown]
[im 1/12]
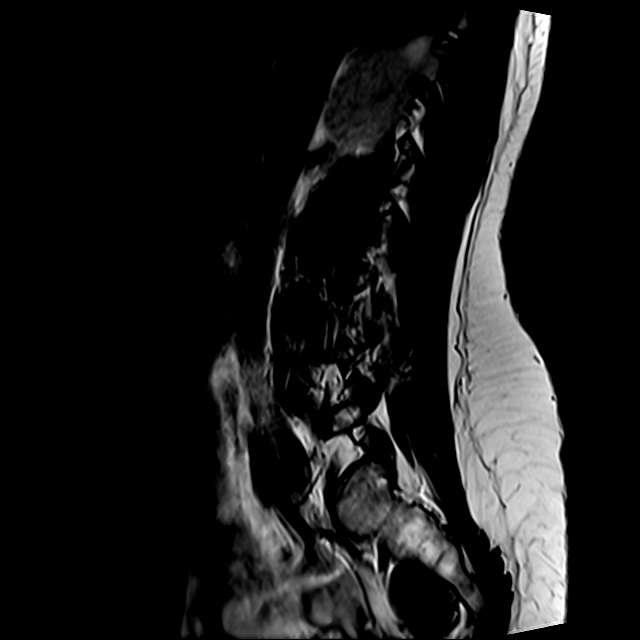
[im 2/12]
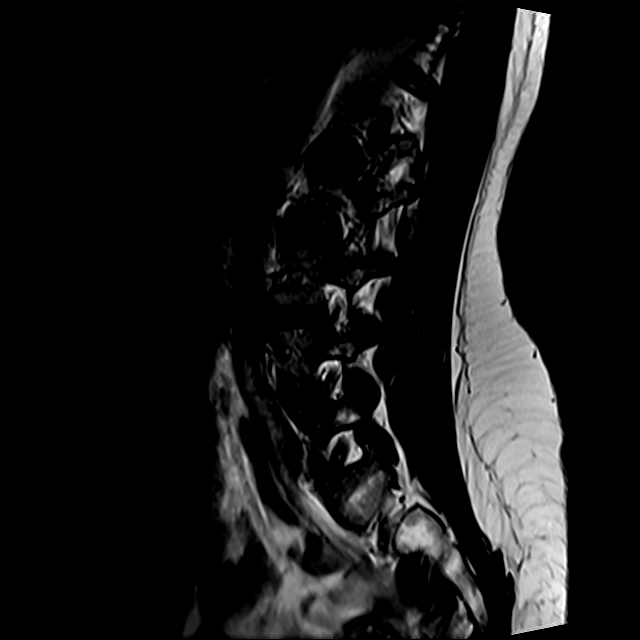
[im 4/12]
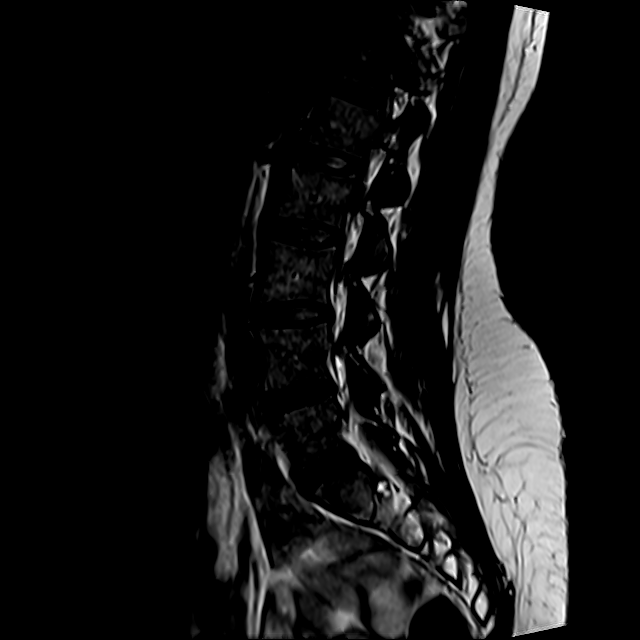
[im 5/12]
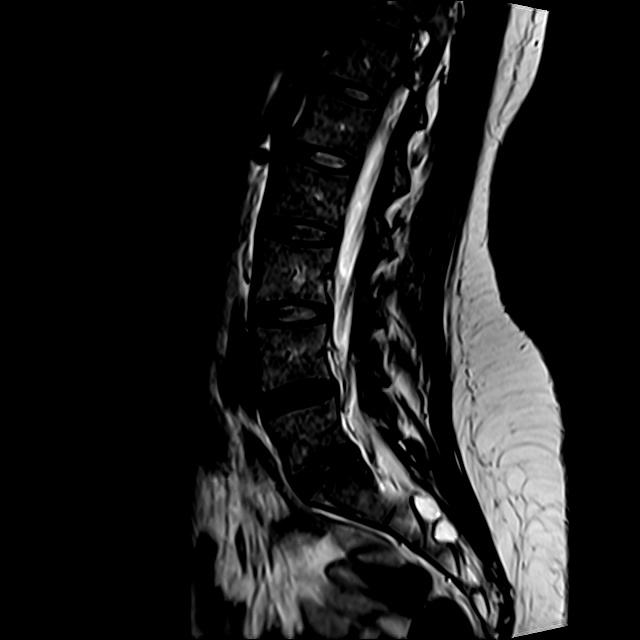
[im 7/12]
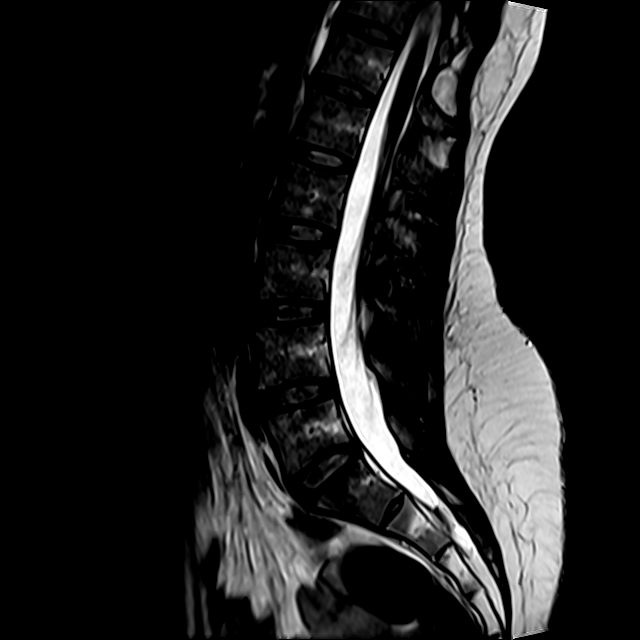
[im 10/12]
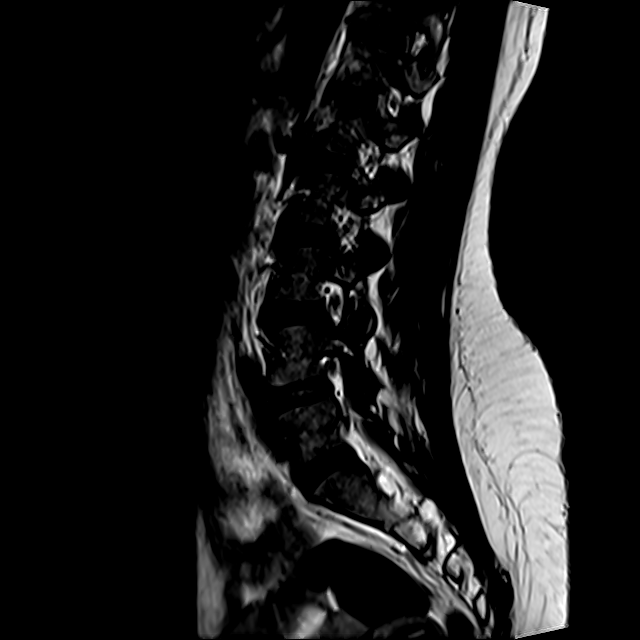

[30 of 48 positions shown; findings below may reference images not displayed]

Técnica:
Exame realizado com sequências ponderadas em T1 e T2, sem a administração intravenosa do agente de
contraste paramagnético.
Relatório:
INFORMAÇÃO ADICIONAL: -
Corpos vertebrais com altura e alinhamento posterior preservados. 
RESSONÂNCIA MAGNÉTICA DA COLUNA LOMBOSSACRA
Osteófitos marginais incipientes em alguns corpos vertebrais 
Leve hipertrofia das articulações interfacetárias. 
Discos intervertebrais com morfologia e sinal normais. 
Níveis L4-L5 e L5-S1: Abaulamentos discais posteriores e difusos, que determinam impressão sobre a face
ventral do saco dural, sem evidentes compressões radiculares ou estenoses foraminais. 
Canal vertebral amplo. 
Cone medular tópico, com espessura e intensidade de sinal preservados. 
Raízes da cauda equina com espessura, distribuição e intensidade de sinal preservados. 
Planos musculares sem anormalidades. 
Edema dos ligamentos interespinhosos lombares, por provável sobrecarga / hipersolicitação mecânica.

[facility]
Conclusão:                               
Espondilodiscoartropatia. 
Leve hipertrofia das articulações interfacetárias. 
Níveis L4-L5 e L5-S1: Abaulamentos discais posteriores e difusos, que determinam impressão sobre a face
INFORMAÇÃO ADICIONAL: -
ventral do saco dural, sem evidentes compressões radiculares ou estenoses foraminais. 
Edema dos ligamentos interespinhosos lombares, por provável sobrecarga / hipersolicitação mecânica.
  Edema  com  densificação  associado  a  lâminas  líquidas  na  tela  subcutânea  da  região  lombar,  relacionado  a
provável estase vascular devido a decúbito prolongado.
[facility]

## 2023-11-24 ENCOUNTER — Inpatient Hospital Stay
Admit: 2023-11-24 | Disposition: A | Discharge status: Home / Self Care | Payer: PRIVATE HEALTH INSURANCE | Attending: Family Medicine | Primary: Family Medicine

## 2023-11-24 DIAGNOSIS — H1012 Acute atopic conjunctivitis, left eye: Secondary | ICD-10-CM

## 2023-11-24 MED ORDER — azelastine (Optivar) 0.05 % ophthalmic solution
0.05 | Freq: Two times a day (BID) | OPHTHALMIC | 0 refills | Status: AC
Start: 2023-11-24 — End: ?

## 2023-11-24 NOTE — Other (Signed)
 Patient Education  Table of Contents   Allergic Conjunctivitis, Adult    To view videos and all your education online visit,  [email or web address].elsevier.com/0dDaoqrx  or scan this QR code with your smartphone.  Access to this content will expire in one year.  Allergic Conjunctivitis, Adult    Allergic conjunctivitis is inflammation of the conjunctiva. The conjunctiva is the thin, clear membrane that covers the white part of the eye and the inner surface of the eyelid. Allergies can affect this layer of the eye. In this condition:   The blood vessels in the conjunctiva swell and become irritated.   The eyes become red or pink and feel itchy.   There is often a watery discharge from the eyes.  Allergic conjunctivitis is not contagious. This means it cannot be spread from person to person. The condition can develop at any age and may be outgrown.  What are the causes?  This condition is caused by allergens. These are things that can cause an allergic reaction in some people. Common allergens include:   Outdoor allergens, such as:  ? Pollen, including pollen from grass and weeds.  ? Mold spores.  ? Car fumes.  ? Pollution.   Indoor allergens, such as:  ? Dust.  ? Smoke.  ? Mold spores.  ? Proteins in a pet's urine, saliva, or dander.   Protein buildup on contact lenses.  What increases the risk?  You may be more likely to develop this condition if you have a family history of these things:   Allergies.   Conditions caused by being exposed to allergens, such as:  ? Allergic rhinitis. This is an allergic reaction that affects the nose.  ? Bronchial asthma. This condition affects the large airways in the lungs and makes breathing difficult.  ? Atopic dermatitis (eczema). This is inflammation of the skin that is long-term (chronic).  What are the signs or symptoms?  Symptoms of this condition include eyes that are itchy, red, watery, or puffy. Your eyes may also:   Sting or burn.   Have clear fluid draining from them.   Have thick  mucus discharge and pain (vernal conjunctivitis). This happens in severe cases.  How is this diagnosed?  This condition may be diagnosed based on:   Your medical history.    A physical exam, including an eye exam.   Tests of the fluid draining from your eyes to rule out other causes.   Other tests to confirm the diagnosis, including:  ? Testing for allergies. The skin may be pricked with a tiny needle. The pricked area is then exposed to small amounts of allergens.  ? Testing for other eye conditions. Tests may include:  ? Blood tests.  ? Tissue scrapings from your eyelid. The tissue is then checked under a microscope.  How is this treated?    Treatment for this condition may include:   Using cold, wet cloths (cold compresses) to soothe itching and swelling.   Washing your face and hair. Also, washing your clothes often to remove allergens.   Using eye drops. These may be prescription or over-the-counter. You may need to try different types to see which one works best for you. Examples include:  ? Eye drops that wash allergens out of the eyes (preservative-free artificial tears).  ? Eye drops that block the allergic reaction (antihistamine).  ? Eye drops that reduce swelling and irritation (anti-inflammatory).  ? Steroid eye drops, which may be given if other treatments have not worked.  Oral antihistamine medicines. These are medicines taken by mouth to lessen your allergic reaction. You may need these if eye drops do not help or are difficult to use.   An air purifier at home and work.   Wraparound sunglasses. This may help to decrease the amount of allergens reaching the eye.   Not wearing contact lenses until symptoms improve, if the condition was caused by contact lenses. Change to daily wear disposable contact lenses, if possible.  Follow these instructions at home:  Eye care   Apply a clean, cold compress to your eyes for 10?20 minutes, 3?4 times a day.   Do not touch or rub your eyes.   Do not wear contact  lenses until the inflammation is gone. Wear glasses instead.   Do not wear eye makeup until the inflammation is gone.  General instructions   Avoid known allergens whenever possible.   Take or apply over-the-counter and prescription medicines only as told by your health care provider. These include any eye drops.   Drink enough fluid to keep your urine pale yellow.   Keep all follow-up visits.  Contact a health care provider if:   Your symptoms get worse or do not get better with treatment.   You have mild eye pain.   You become sensitive to light.   You have spots or blisters on your eyes.   You have a fever.  Get help right away if:   You have redness, swelling, or other symptoms in only one eye.   Your vision is blurred or you have other vision changes.   You have pus draining from your eyes.   You have severe eye pain.  Summary   Allergic conjunctivitis is inflammation of the eye that is caused by allergens. It affects the clear membrane that covers the white part of the eye and the inner surface of the eyelid.   It often causes eye itching, redness, and a watery discharge.   Take or apply over-the-counter and prescription medicines only as told by your health care provider. These include eye drops.   Do not touch or rub your eyes.   Contact a health care provider if your symptoms get worse or do not get better with treatment.  This information is not intended to replace advice given to you by your health care provider. Make sure you discuss any questions you have with your health care provider.  Document Released: 2002-09-25 Document Updated: 2021-09-14 Document Reviewed: 2021-09-14  Elsevier Patient Education ? 2025 [employer].

## 2023-11-24 NOTE — Discharge Instructions (Addendum)
 I recommend the use of an over-the-counter antihistamine such as cetirizine (Zyrtec), loratadine (Claritin), fexofenadine (Allegra), levocetirizine (Xyzal).  You may also benefit from a nasal corticosteroid such as fluticasone (Flonase) or triamcinolone (Nasacort) these can also be found over-the-counter.

## 2023-11-24 NOTE — ED Provider Notes (Signed)
 History  Chief Complaint   Patient presents with    conjuntivitis     L eye woke up red, itchy with discharge.        This is a 33 year old woman who presents today with complaints of left eye redness and pruritus.  This has been going on since awakening this morning.  She has had no trauma to the eye, does not wear contact lenses.  She notes that her allergies have been intense this year.      Problem List[1]    Past Medical History:   Diagnosis Date    Breech presentation (HHS-HCC)     History of abnormal cervical Pap smear 10/22/2020        Past Surgical History:   Procedure Laterality Date    CESAREAN SECTION, LOW TRANSVERSE  10/24/2019    TONSILLECTOMY N/A 2001        Family History[2]    Social History     Tobacco Use    Smoking status: Former     Current packs/day: 0.00     Average packs/day: 0.5 packs/day for 5.0 years (2.5 ttl pk-yrs)     Types: Cigarettes     Start date: 2011     Quit date: 2016     Years since quitting: 9.3     Passive exposure: Past    Smokeless tobacco: Never   Vaping Use    Vaping status: Never Used   Substance Use Topics    Alcohol use: Yes     Comment: occasional    Drug use: Never       Allergies   Allergen Reactions    Amoxicillin Rash    Augmentin [Amoxicillin-Pot Clavulanate] Rash     Other reaction(s): Itching skin        Review of Systems   Constitutional:  Negative for chills and fever.   HENT:  Negative for ear pain and sore throat.    Eyes:  Positive for redness and itching. Negative for pain and visual disturbance.   Respiratory:  Negative for cough and shortness of breath.    Cardiovascular:  Negative for chest pain and palpitations.   Gastrointestinal:  Negative for abdominal pain and vomiting.   Genitourinary:  Negative for dysuria and hematuria.   Musculoskeletal:  Negative for arthralgias and back pain.   Skin:  Negative for color change and rash.   Neurological:  Negative for seizures and syncope.   All other systems reviewed and are negative.      Physical  Exam  Vitals:    11/24/23 0820   BP: 117/70   BP Location: Left arm   Patient Position: Sitting   Pulse: 88   Resp: 16   Temp: 36.6 C (97.9 F)   TempSrc: Oral   SpO2: 99%   Weight: 54.4 kg   Height: 1.626 m       Physical Exam  Vitals and nursing note reviewed.   Constitutional:       General: She is not in acute distress.     Appearance: Normal appearance. She is well-developed.   HENT:      Head: Normocephalic and atraumatic.   Eyes:      General:         Right eye: No discharge.         Left eye: No discharge.      Extraocular Movements: Extraocular movements intact.      Conjunctiva/sclera:      Right eye: No exudate.  Left eye: Left conjunctiva is injected. No exudate.     Pupils: Pupils are equal, round, and reactive to light.   Pulmonary:      Effort: Pulmonary effort is normal. No respiratory distress.   Musculoskeletal:      Cervical back: Neck supple.   Skin:     General: Skin is warm and dry.   Neurological:      Mental Status: She is alert.   Psychiatric:         Mood and Affect: Mood normal.         Behavior: Behavior normal.              No orders to display     Labs Reviewed - No data to display    Procedures  Procedures    UC Course  Diagnoses as of 11/24/23 0829   Allergic conjunctivitis of left eye       Medical Decision Making  Physical exam is notable for injection of the conjunctiva of the left eye.  Right conjunctiva is clear.  There is no evidence of discharge.  Will treat allergic conjunctivitis using Optivar.    Problems Addressed:  Allergic conjunctivitis of left eye: complicated acute illness or injury    Risk  Prescription drug management.        Discharge Meds  ED Prescriptions       Medication Sig Dispense Start Date End Date Auth. Provider    azelastine (Optivar) 0.05 % ophthalmic solution Administer 1 drop into both eyes twice daily. 6 mL 11/24/2023 -- Emma Dance, MD            Home Meds  Prior to Admission medications    Medication Sig Start Date End Date Taking? Authorizing  Provider   norgestimate-ethinyl estradioL (Ortho-Cyclen) 0.25-35 mg-mcg tablet Take 1 tablet by mouth once daily. 07/25/23  Yes William Galvin III, MD       Patient encounter note may have been created using voice recognition software and in real time during the office visit. Please excuse any typographical errors that may not have been edited out.              [1]   Patient Active Problem List  Diagnosis    ASCUS of cervix with negative high risk HPV    History of abnormal cervical Pap smear    Anemia    Migraine    [2]   Family History  Problem Relation Name Age of Onset    Depression Father      Cancer Maternal Grandmother      Lung cancer Paternal Grandfather      Breast cancer Other Maternal Aunt         Emma Dance, MD  11/24/23 [phone]

## 2023-12-27 NOTE — Progress Notes (Signed)
 Please send pap reminder letter

## 2024-01-26 NOTE — Progress Notes (Signed)
 Pt due for pap. Please call to schedule

## 2024-03-20 ENCOUNTER — Ambulatory Visit
Admit: 2024-03-20 | Discharge: 2024-03-20 | Payer: BLUE CROSS/BLUE SHIELD | Attending: Family Medicine | Primary: Family Medicine

## 2024-03-20 DIAGNOSIS — R110 Nausea: Principal | ICD-10-CM

## 2024-03-20 MED ORDER — omeprazole (PriLOSEC) 20 mg DR capsule
20 | ORAL_CAPSULE | Freq: Every day | ORAL | 1 refills | 60.00000 days | Status: AC
Start: 2024-03-20 — End: 2024-09-16

## 2024-03-20 MED ORDER — ondansetron (Zofran) 4 mg tablet
4 | ORAL_TABLET | Freq: Three times a day (TID) | ORAL | 0 refills | 7.00000 days | Status: AC | PRN
Start: 2024-03-20 — End: 2024-03-27

## 2024-03-20 MED ORDER — meclizine (Antivert) 12.5 mg tablet
12.5 | ORAL_TABLET | Freq: Three times a day (TID) | ORAL | 11 refills | 10.00000 days | Status: AC | PRN
Start: 2024-03-20 — End: 2025-03-20

## 2024-03-20 NOTE — Progress Notes (Signed)
 [facility]  [address]  [address]  [place] [address]    Patient ID: Emma Hartman is a 33 y.o. female who presents for Dizziness (Specially in the morning, pt reports that this has been ongoing on and off for couple of weeks. Triggered with position changes. ) and Nausea (Upset stomach xcouple of days.).          Subjective     History of Present Illness  The patient is a 32 year old female who presents for dizziness, especially in the morning, nausea, and an upset stomach for a couple of days.    She has been experiencing an upset stomach for the past 2 months, initially attributing it to whole milk. Despite switching to oat and soy milk, the frequency of her symptoms has increased, particularly after meals. She experiences nausea every morning, which intensifies after eating and subsides by midday. Her food intake has decreased due to the nausea, but she maintains her fluid intake, finding that sparkling water alleviates her symptoms. She has not lost weight recently and reports no changes in bowel movements, constipation, diarrhea, or blood in stool. She also reports no sour taste or burning pain in the middle of her chest. Her eating habits have remained consistent over the past 6 months. She has not tried any medications for her symptoms. She does not believe she is pregnant as she is consistent with her birth control and had her last period 20 days ago. She started taking Ortho Tri-Cyclen  in January or February 2025 without any associated nausea.    She has been experiencing intermittent dizziness for the past few months, which has become more frequent recently. The dizziness, described as lightheadedness, lasts for a couple of hours and is relieved by lying down. She also experiences nausea during these episodes and believes the two symptoms may be related. She does not experience headaches during these episodes but did wake up with a headache yesterday, followed by an  upset stomach and dizziness. She reports no ringing in her ears, changes in hearing or vision, or balance issues. However, she sometimes feels as though she might fall. She believes her dizziness is triggered by head movements or changes in position.    She occasionally experiences headaches and has a history of migraines, which runs in her family. Previous attempts to manage her migraines with medication were unsuccessful.    Diet: Consumes oat milk and soy milk; avoids whole milk; drinks coffee and tea; avoids food due to nausea  Coffee/Tea/Caffeine-containing Drinks: Drinks coffee and tea    GYNECOLOGICAL HISTORY:  Last Menstrual Period: 20 days ago    FAMILY HISTORY  She has a family history of migraines.  ROS:     See HPI    Past Medical History  She has a past medical history of Breech presentation (HHS-HCC) and History of abnormal cervical Pap smear (10/22/2020).    Medications    Current Outpatient Medications:     meclizine  (Antivert ) 12.5 mg tablet, Take 1 tablet (12.5 mg) by mouth if needed in the morning, at noon, and at bedtime for dizziness., Disp: 30 tablet, Rfl: 11    norgestimate -ethinyl estradioL  (Ortho-Cyclen) 0.25-0.035 mg tablet, Take 1 tablet by mouth once daily., Disp: 84 tablet, Rfl: 4    omeprazole  (PriLOSEC) 20 mg DR capsule, Take 1 capsule (20 mg) by mouth once daily. Do not crush or chew., Disp: 90 capsule, Rfl: 1    Medications  Start Medication Dose/Rate, Route, Frequency Ordered Stop    03/20/24 0000 ondansetron  (Zofran ) 4 mg tablet         4 mg, oral, Every 8 hours PRN 03/20/24 1045 03/27/24 2359    03/20/24 0000 omeprazole  (PriLOSEC) 20 mg DR capsule         20 mg, oral, Daily 03/20/24 1048 09/16/24 2359    03/20/24 0000 meclizine  (Antivert ) 12.5 mg tablet         12.5 mg, oral, 3 times daily PRN 03/20/24 1049 03/20/25 2359                    Surgical History  She has a past surgical history that includes Cesarean section, low transverse (10/24/2019) and Tonsillectomy (N/A,  2001).     Social History  She reports that she quit smoking about 9 years ago. Her smoking use included cigarettes. She started smoking about 14 years ago. She has a 2.5 pack-year smoking history. She has been exposed to tobacco smoke. She has never used smokeless tobacco. She reports current alcohol use. She reports that she does not use drugs.    Family History  Family History[1]     Advance Care Plan  Extended Emergency Contact Information  Primary Emergency Contact: [facility]  Mobile Phone: [phone]  Relation: Spouse  Interpreter needed? No  No Order       Allergies  Amoxicillin and Augmentin [amoxicillin-pot clavulanate]    Objective   Visit Vitals  BP 104/70   Pulse 73   Ht 1.626 m   Wt 53.1 kg   SpO2 99%   BMI 20.07 kg/m   OB Status Having periods   BSA 1.55 m     Physical Exam  General: Alert, NAD.  Head: NC/AT.  Neck: Supple, no thyromegaly. No carotid bruits, no JVD.  Ears: Right eardrum and left eardrum are retracted.  Nose: Nasal mucosa is boggy and edematous.  Mouth/Throat: Moderate amount of thick white postnasal drip is present.  Respiratory: Clear to auscultation, no wheezing, rales or rhonchi.  Cardiovascular: Regular rate and rhythm, no murmurs, rubs, or gallops.  Gastrointestinal: Soft, no tenderness, no distention, no masses.        Results       Diagnosis Plan   1. Nausea  ondansetron  (Zofran ) 4 mg tablet    omeprazole  (PriLOSEC) 20 mg DR capsule      2. Dizziness  meclizine  (Antivert ) 12.5 mg tablet    LCO  [facility]         Assessment & Plan  1. Nausea:  - The patient reports nausea, especially in the morning, which has been ongoing for the past two months and has worsened over the last three days.  - Physical exam findings include a moderate amount of thick white postnasal drip and boggy, edematous nasal mucosa. The patient's right and left eardrums are retracted.  - A discussion about potential causes of nausea, including peptic ulcer disease or gastritis,  was conducted. The patient was advised to see a GI specialist for further evaluation.  - Zofran  was prescribed to be taken as needed for nausea. Omeprazole  was prescribed to be taken daily on an empty stomach, with food consumed 30 minutes later. A referral to a gastroenterologist was made.    2. Dizziness:  - The patient reports dizziness that occurs when bending down or turning the head too fast, lasting a few hours and sometimes accompanied by nausea. The dizziness has been more  frequent in the past couple of weeks.  - Physical exam findings include retracted eardrums, indicating possible ear-related issues.  - A discussion about the potential ear-related cause of dizziness and the possibility of it being related to migraines was conducted. The patient was advised to see an ENT specialist for further evaluation.  - Meclizine  was prescribed to be taken as needed for dizziness. A referral to an ENT specialist was made.    3. Migraine:  - The patient has a history of migraines, which may be contributing to her dizziness and nausea. She experiences headaches sometimes but not consistently with dizziness.  - A discussion about monitoring migraine symptoms and the potential link to dizziness and nausea was conducted. The patient was advised to follow up if experiencing more than four days of headaches per month.    Follow-up: A follow-up visit is scheduled in 2 to 3 months.      This visit note was drafted with the help of artificial intelligence. I obtained consent to record the visit from all participants for this purpose.    Mckaylah Bettendorf, DO         [1]   Family History  Problem Relation Name Age of Onset    Depression Father      Cancer Maternal Grandmother      Lung cancer Paternal Grandfather      Breast cancer Other Maternal Aunt

## 2024-03-21 NOTE — Telephone Encounter (Signed)
 Message forwarded to CHONG SO, DO

## 2024-03-21 NOTE — Telephone Encounter (Signed)
 Received a call from the patient she was seen yesterday by Dr So for dizziness & nausea he referred her to [facility] and she thought she was going to get referred to GI for the nausea I don't see any referral order in the patient chart will she needs to be seen by GI there is nothing in the notes from yesterday visit for her to be seen by GI

## 2024-03-21 NOTE — Telephone Encounter (Signed)
 Per Dr. So yes, refer to GI. Referral placed.

## 2024-03-22 ENCOUNTER — Ambulatory Visit
Admit: 2024-03-22 | Discharge: 2024-03-22 | Payer: BLUE CROSS/BLUE SHIELD | Attending: Specialist | Primary: Family Medicine

## 2024-03-22 DIAGNOSIS — Z0000 Encounter for general adult medical examination without abnormal findings: Principal | ICD-10-CM

## 2024-03-22 MED ORDER — norgestimate-ethinyl estradioL (Ortho-Cyclen) 0.25-0.035 mg tablet
0.25-0.035 | ORAL_TABLET | Freq: Every day | ORAL | 4 refills | 84.00000 days | Status: AC
Start: 2024-03-22 — End: ?

## 2024-03-22 NOTE — Progress Notes (Signed)
 Subjective   Emma Hartman is a 33 y.o. G1P1 pre-menopausal woman who presents for annual exam.   She is doing well with no specific GYN complaints.  Her periods are regular now on OCPs and she is happy with them.  She does not desire to get pregnant again at this time.  Steroids sexual active relationship.  No domestic violence.  No new medical problems or medications, other than recent onset of some nausea which is being evaluated.    History of Present Illness         Periods - regular  Recent changes to her health/medsyes  nausea  The patient is sexually active.  Stable relation yes  regular exercise: yes.   The patient reports that there is not domestic violence in their life.    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     Medical History[1]   Surgical History[2]   Medications Ordered Prior to Encounter[3]   Social History     Socioeconomic History    Marital status: Married     Spouse name: Not on file    Number of children: Not on file    Years of education: Not on file    Highest education level: Not on file   Occupational History    Occupation: Haematologist [employer]   Tobacco Use    Smoking status: Former     Current packs/day: 0.00     Average packs/day: 0.5 packs/day for 5.0 years (2.5 ttl pk-yrs)     Types: Cigarettes     Start date: 2011     Quit date: 2016     Years since quitting: 9.6     Passive exposure: Past    Smokeless tobacco: Never   Vaping Use    Vaping status: Never Used   Substance and Sexual Activity    Alcohol use: Yes     Comment: occasional    Drug use: Never    Sexual activity: Yes     Partners: Male     Birth control/protection: OCP   Other Topics Concern    Not on file   Social History Narrative    Not on file     Social Determinants of Health     Financial Resource Strain: Low Risk  (08/15/2023)    Overall Financial Resource Strain (CARDIA)     Difficulty of Paying Living Expenses: Not hard at all   Food Insecurity: Unknown (08/15/2023)    Hunger Vital Sign     Worried About Running Out  of Food in the Last Year: Never true     Ran Out of Food in the Last Year: Not on file   Transportation Needs: No Transportation Needs (08/15/2023)    PRAPARE - Therapist, art (Medical): No     Lack of Transportation (Non-Medical): No   Physical Activity: Not on file   Stress: Not on file   Social Connections: Not on file   Intimate Partner Violence: Not on file   Housing Stability: Unknown (08/14/2023)    Housing Stability Vital Sign     Unable to Pay for Housing in the Last Year: Not on file     Number of Times Moved in the Last Year: Not on file     Homeless in the Last Year: No      Family History[4]       There were no vitals taken for this visit.  Objective   Physical Exam  General:   alert and oriented, in no acute distress       Breast No masses, tenderness, skin changes, lymph nodes,   Fibrocystic changes: no  Breast implants no   Abdomen: soft, non-tender, without masses or organomegaly   Vulva: normal   Vagina: normal mucosa, normal discharge   Cervix: anteverted and nulliparous appearance   Uterus: normal size, anteverted, mobile, non-tender   Adnexa: no mass, fullness, tenderness         Assessment/Plan   Assessment & Plan    33 year old G1, P1 premenopausal woman in good health.  Her GYN exam today was normal.  Pap smear was repeated.  If this is HPV positive or Pap abnormal, she will need a repeat colposcopy.  If both are normal, she will need a repeat Pap in 1 year.  This was all explained to her.  I refilled her birth control pill for 1 year.  She will follow-up as needed.  Copy to PCP.       This visit note was drafted with the help of artificial intelligence. I obtained consent to record the visit from all participants for this purpose.    Charlett Merkle Galvin III, MD          [1]   Past Medical History:  Diagnosis Date    Breech presentation (HHS-HCC)     History of abnormal cervical Pap smear 10/22/2020   [2]   Past Surgical History:  Procedure Laterality Date     CESAREAN SECTION, LOW TRANSVERSE  10/24/2019    TONSILLECTOMY N/A 2001   [3]   Current Outpatient Medications on File Prior to Visit   Medication Sig Dispense Refill    meclizine  (Antivert ) 12.5 mg tablet Take 1 tablet (12.5 mg) by mouth if needed in the morning, at noon, and at bedtime for dizziness. 30 tablet 11    norgestimate -ethinyl estradioL  (Ortho-Cyclen) 0.25-35 mg-mcg tablet Take 1 tablet by mouth once daily. 84 tablet 4    omeprazole  (PriLOSEC) 20 mg DR capsule Take 1 capsule (20 mg) by mouth once daily. Do not crush or chew. 90 capsule 1    ondansetron  (Zofran ) 4 mg tablet Take 1 tablet (4 mg) by mouth every 8 (eight) hours if needed for nausea or vomiting for up to 7 days. 20 tablet 0    [DISCONTINUED] azelastine  (Optivar ) 0.05 % ophthalmic solution Administer 1 drop into both eyes twice daily. 6 mL 0     No current facility-administered medications on file prior to visit.   [4]   Family History  Problem Relation Name Age of Onset    Depression Father      Cancer Maternal Grandmother      Lung cancer Paternal Grandfather      Breast cancer Other Maternal Aunt

## 2024-03-22 NOTE — Addendum Note (Signed)
 Addended by: MAYO DOMINO on: 03/22/2024 09:56 AM     Modules accepted: Orders

## 2024-03-23 NOTE — Telephone Encounter (Signed)
 Referral order and last office note sent to GI they will call the patient to make an apt

## 2024-03-23 NOTE — Telephone Encounter (Signed)
 Received a fax from [facility] with a apt notices on Oct 28,2025 with NP Katelynn Cataldo    Patient is aware

## 2024-03-27 LAB — PAP SMEAR AND HPV DNA: HPV Aptima: NEGATIVE

## 2024-04-12 ENCOUNTER — Ambulatory Visit
Admit: 2024-04-12 | Discharge: 2024-04-12 | Payer: BLUE CROSS/BLUE SHIELD | Attending: Otolaryngology | Primary: Family Medicine

## 2024-04-12 NOTE — Assessment & Plan Note (Signed)
 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 At this point they are feeling better. As they didn't want to exacerbate the symptoms they declined [facility] testing which would have confirmed the diagnosis. Will continue with observation. The pathophysiology of BPPV was reviewed with them and literature was provided. There is a risk of recurrence, and if this occurs will call to be seen on an urgent basis for examination, diagnosis, and treatment.v

## 2024-04-12 NOTE — Progress Notes (Signed)
 [facility]  [address], [address]  [place] [address]  [phone]  Dept Fax: [phone]    CHIEF COMPLAINT: vertigo    History of Present Illness  The patient is a female who presents for evaluation of dizziness.    She was referred by her primary care physician due to recent episodes of dizziness. These episodes are triggered by rapid head movements or bending over quickly, and they persist throughout the day, often leading to headaches and migraines. The dizziness is a new symptom that has been occurring more frequently over the past few months. She reports no ear pain, discharge, or hearing loss. Her primary care physician prescribed medication for her stomach, which she takes before breakfast, and this has alleviated her dizziness and nausea. She does not experience vertigo when lying down but feels dizzy when moving or bending quickly. Additionally, she experiences nausea after eating in the morning.         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[employer]   Tobacco Use    Smoking status: Former     Current packs/day: 0.00     Average packs/day: 0.5 packs/day for 5.0 years (2.5 ttl pk-yrs)     Types: Cigarettes     Start date: 2011     Quit date: 2016     Years since quitting: 9.7     Passive exposure: Past    Smokeless tobacco: Never   Vaping Use    Vaping status: Never Used   Substance and Sexual Activity    Alcohol use: Yes     Comment: occasional    Drug use: Never    Sexual activity: Yes     Partners: Male     Birth control/protection: OCP   Other Topics Concern    Not on file   Social History Narrative    Not on file     Social Determinants of Health      Financial Resource Strain: Low Risk  (08/15/2023)    Overall Financial Resource Strain (CARDIA)     Difficulty of Paying Living Expenses: Not hard at all   Food Insecurity: Unknown (08/15/2023)    Hunger Vital Sign     Worried About Running Out of Food in the Last Year: Never true     Ran Out of Food in the Last Year: Not on file   Transportation Needs: No Transportation Needs (08/15/2023)    PRAPARE - Therapist, art (Medical): No     Lack of Transportation (Non-Medical): No   Physical Activity: Not on file   Stress: Not on file   Social Connections: Not on file   Intimate Partner Violence: Not on file   Housing Stability: Unknown (08/14/2023)    Housing Stability Vital Sign     Unable to Pay for Housing in the Last Year: Not on file     Number of Times Moved in the Last Year: Not on file     Homeless in the Last Year: No       PHYSICAL EXAM:  General:  Constitutional: no distress  Eyes:  Right: EOM intact   Left: EOM intact  Ears:  Right ear:   Pinna - Normal  External canal - unremarkable  Tympanic membrane - unremarkable  Left ear:  [place] -  Normal  External canal - unremarkable  Tympanic membrane - unremarkable  Nose:  Speculum exam: unremarkable  Oral Cavity/Oropharynx:   Lips - normal   FOM - unremarkable   Tongue - midline  Oropharynx - unremarkable   Oral Vestibule -  unremarkable   Palate - unremarkable  Neck:   Palpation - no adenopathy and no masses  Skin of Head/Neck - Skin color, texture, turgor normal. 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 At this point they are feeling better. As they didn't want to exacerbate the symptoms they declined [facility] testing which would have confirmed the diagnosis. Will continue with observation.  The pathophysiology of BPPV was reviewed with them and literature was provided. There is a risk of recurrence, and if this occurs will call to be seen on an urgent basis for examination, diagnosis, and treatment.v   Orders:  -     LCO  [facility]  Other orders  -     Auditory function tests       This visit note was drafted with the help of artificial intelligence. I obtained consent to record the visit from all participants for this purpose.                  [1]   Past Medical History:  Diagnosis Date    Breech presentation (HHS-HCC)     History of abnormal cervical Pap smear 10/22/2020   [2]   Past Surgical History:  Procedure Laterality Date    CESAREAN SECTION, LOW TRANSVERSE  10/24/2019    TONSILLECTOMY N/A 2001   [3]   Current Outpatient Medications   Medication Sig Dispense Refill    meclizine  (Antivert ) 12.5 mg tablet Take 1 tablet (12.5 mg) by mouth if needed in the morning, at noon, and at bedtime for dizziness. 30 tablet 11    norgestimate -ethinyl estradioL  (Ortho-Cyclen) 0.25-0.035 mg tablet Take 1 tablet by mouth once daily. 84 tablet 4    omeprazole  (PriLOSEC) 20 mg DR capsule Take 1 capsule (20 mg) by mouth once daily. Do not crush or chew. 90 capsule 1     No current facility-administered medications for this visit.   [4]   Allergies  Allergen Reactions    Amoxicillin Rash    Augmentin [Amoxicillin-Pot Clavulanate] Rash     Other reaction(s): Itching skin   [5]   Family History  Problem Relation Name Age of Onset    Depression Father Jose     Cancer Maternal Grandmother Hadassah     Diabetes Maternal Jeannine Hadassah     Lung cancer Paternal Apolinar Sieving     Mental illness Paternal Apolinar Sieving     Breast cancer Other Maternal Aunt

## 2024-06-19 ENCOUNTER — Encounter: Payer: BLUE CROSS/BLUE SHIELD | Attending: Family Medicine | Primary: Family Medicine

## 2024-08-15 ENCOUNTER — Ambulatory Visit
Admit: 2024-08-15 | Discharge: 2024-08-15 | Payer: BLUE CROSS/BLUE SHIELD | Attending: Family Medicine | Primary: Family Medicine

## 2024-08-15 DIAGNOSIS — Z0000 Encounter for general adult medical examination without abnormal findings: Principal | ICD-10-CM

## 2024-08-15 NOTE — Progress Notes (Signed)
 Outpatient  H&P  [facility]  [address]  [address]  [place] [address]  [phone]    Today's Date: 08/20/2024  MRN: [id number]  Name: Emma Hartman  DOB: [birth year]    Chief Complaint  Chief Complaint   Patient presents with    Annual Exam     Please inform patient: Today's visit is scheduled as your routine annual physical, which focuses on preventive care such as check-ups, screenings, and lifestyle counseling. If you'd like to discuss or be treated for any medical issues -- changes in any chronic conditions, or new symptoms -- that portion will be billed separately as a problem-oriented visit (such as 99213), and your insurance may apply a copay or deductible.    Subjective     History of Present Illness  The patient is a 34 year old female who presents for a physical exam.    She maintains a diverse diet, including fruits, vegetables, chicken, and fish, while avoiding excessive fried and processed foods. Her carbohydrate intake includes bread, pasta, rice, potatoes, and spaghetti, with a preference for darker, less processed bread. She also consumes homemade bread without preservatives. Her fluid intake primarily consists of water and sugar-free teas, with occasional consumption of soda.     She does not engage in regular exercise due to her busy schedule, which includes work, childcare, and household chores. She typically wakes up at 5:40 AM and goes to bed early. She rarely consumes junk food, limiting it to once a week.     She had an eye examination in 2024 and received a new prescription, with a follow-up scheduled for 2026. She does not require glasses for all activities, only for work. She does not smoke. She has been having skin exams.     She has been experiencing spider veins on both legs, which are more noticeable during the summer due to her fair skin. These veins cause significant discomfort, particularly in cold weather. She is interested in exploring treatment options for these  veins.    She was supposed to come in 06/2024 for a follow-up for dizziness, stomach issues, and nausea. She went to a specialist for her dizziness, and the doctor recommended stopping the pills by the end of 2025. For this month, she has been fine with no more nausea, sickness, or dizziness. She has been off the pills for the last month. Her ear testing came out good, and her hearing is fine. She is up to date with her gynecological exams. She saw Dr. Galvin who did a breast exam and Pap smear in 03/2024.    Social History:  Diet: Diverse diet including fruits, vegetables, chicken, fish, bread, pasta, rice, potatoes, and spaghetti. Prefers darker, less processed bread and homemade bread without preservatives.  Tobacco: Does not smoke  Coffee/Tea/Caffeine-containing Drinks: Consumes sugar-free teas  Sleep: Typically wakes up at 5:40 AM and goes to bed early    FAMILY HISTORY  Her grandmother had a lot of problems with spider veins. Her mother does not have any issues with spider veins.    Past Medical History  She has a past medical history of Breech presentation and History of abnormal cervical Pap smear (10/22/2020).    Surgical History  She has a past surgical history that includes Cesarean section, low transverse (10/24/2019) and Tonsillectomy (N/A, 2001).     Social History  She reports that she quit smoking about 10 years ago. Her smoking use included cigarettes. She started smoking about 15 years ago.  She has a 2.5 pack-year smoking history. She has been exposed to tobacco smoke. She has never used smokeless tobacco. She reports current alcohol use. She reports that she does not use drugs.    Family History  Family History[1]     Advance Care Plan  Extended Emergency Contact Information  Primary Emergency Contact: [facility]  Mobile Phone: [phone]  Relation: Spouse  Interpreter needed? No  No Order       Allergies  Amoxicillin and Augmentin [amoxicillin-pot clavulanate]     Medications    Current  Outpatient Medications:     meclizine  (Antivert ) 12.5 mg tablet, Take 1 tablet (12.5 mg) by mouth if needed in the morning, at noon, and at bedtime for dizziness., Disp: 30 tablet, Rfl: 11    norgestimate -ethinyl estradioL  (Ortho-Cyclen) 0.25-0.035 mg tablet, Take 1 tablet by mouth once daily., Disp: 84 tablet, Rfl: 4    omeprazole  (PriLOSEC ) 20 mg DR capsule, Take 1 capsule (20 mg) by mouth once daily. Do not crush or chew. (Patient not taking: Reported on 08/15/2024), Disp: 90 capsule, Rfl: 1    Medications       None            ROS    See HPI for any additional ROS  Constitution:  No fever, no chills, no fatigue, no unexplained weight loss, no unexplained weght gain.  HEENT:  No change in vision, no change in hearing, no change in voice, no dysphagia.  Resp:  No SOB, no coughing.  Cardiac: No chest pain, no palpitation.  Abd:  No stomach pain, no nausea, no vomiting, no acid reflux, no heart burn, no diarrhea, no constipation.  GU:  No dysuria, no hematuria, no urinary incontinence.  Neuro:  No LOC, no seizure, no headache, no numbness, no dizziness.  Psy:  No anxiety, no depression, no SI/HI, no trouble sleeping.  M/S:  No muscle pain, no joint pain, no back pain.  Skin:  No worrisome moles, no rash.  Endo:  No cold intolerance, no heat intolerance, no polyuria nor polydipsia.  Heme/Lym:  No abnormal bleeding, no easy bruising, no lymphadenopathy.  Others: None.    Objective   Last Recorded Vitals  Blood pressure 104/74, pulse 88, resp. rate 19, weight 52.2 kg, SpO2 100%.  Physical Exam  General:  AXO x 3, NAD.  Head: NC/AT.  HEENT: PERRLA, EOMI, oral pharynx normal.  Neck: Supple, no thyromegaly.  Respiratory: CTA B/L, good air movement, no wheezes, no crackles, no rales, no ronchi.  Cardiovascular: S1S2, RRR, No murmur, gallop or rub.  Abdominal: ND/NT, no abdominal pain, no guarding or rebound, no HSM.  Neurologic: CN 2-12 grossly normal, DTR symmetrical throughout.  M/S: Normal gait, FROM X 4.  Lymphatic: No  periauricular, cervical, clavicular, axillary or inguinal lymphadenopathy.  Lower extremity: No cyanosis, no ecchymosis, no edema.  Psychiatric:  Normal affect, normal mood, no SI/HI.  Skin:  Warm and dry. No suspcious lesion noted. Spider veins noted on both lower extremities  GU: Per gyn.  Breast: Per gyn.    Results       Diagnosis Plan   1. Annual physical exam        2. Spider veins of both lower extremities  INT  [facility]        Assessment & Plan  1. Annual physical examination:  - Weight is within the normal range. Blood pressure readings today are excellent.   - The most recent blood  work conducted last year yielded normal results.  - She was advised to incorporate wheat bread and darker pasta into her diet. A recommendation was made to watch a YouTube video about the quality of teas.  - The use of the [employer] app was suggested for selecting healthy food options for her family.    2. Spider veins in both lower extremities:  - A referral to a local vascular surgeon has been initiated.  - In the interim, she may consider using compression stockings to alleviate symptoms. If she does not receive a call from the vascular surgeon within 2 weeks, she should contact the office.    Follow-up: The patient will follow up in 1 year for a physical examination.    Healthy life style discussed. Avoid white food - eat more brown food. Do 150 min of moderately intense exercise or 45 min of aerobic exercise per week. Eat 5 servings of fruits or vegetable per day. Limit caffeine to 21 oz per day. Drink about 40-50 oz of low carb no caffeine beverages per day. Advised to sleep 6-8 hours a day. Advised Vit D3 2000 units a day in Spring, Fall and Winter.     Advised eye exam - importance discussed.     Patient Health Questionnaire-2 Score: 0 Interpretation: Negative screening.      Follow-up & Interventions:   - Maintain annual screening - No additional Follow-up required.    This visit note  was drafted with the help of artificial intelligence. I obtained consent to record the visit from all participants for this purpose.    Javien Tesch, DO         [1]   Family History  Problem Relation Name Age of Onset    Depression Father Jose     Cancer Maternal Grandmother Hadassah     Diabetes Maternal Jeannine Hadassah     Lung cancer Paternal Apolinar Sieving     Mental illness Paternal Apolinar Sieving     Breast cancer Other Maternal Aunt

## 2024-09-12 ENCOUNTER — Encounter: Payer: BLUE CROSS/BLUE SHIELD | Primary: Family Medicine
